# Patient Record
Sex: Female | Born: 1935 | Race: White | Hispanic: No | Smoking: Never smoker
Health system: Southern US, Community
[De-identification: ages and names within clinical notes are randomized; demographics above are authoritative.]

## PROBLEM LIST (undated history)

## (undated) DIAGNOSIS — M1990 Unspecified osteoarthritis, unspecified site: Secondary | ICD-10-CM

## (undated) DIAGNOSIS — I4891 Unspecified atrial fibrillation: Secondary | ICD-10-CM

## (undated) DIAGNOSIS — I10 Essential (primary) hypertension: Secondary | ICD-10-CM

---

## 2016-09-13 DIAGNOSIS — M1990 Unspecified osteoarthritis, unspecified site: Secondary | ICD-10-CM | POA: Insufficient documentation

## 2016-09-13 DIAGNOSIS — F419 Anxiety disorder, unspecified: Secondary | ICD-10-CM

## 2016-09-13 DIAGNOSIS — G479 Sleep disorder, unspecified: Secondary | ICD-10-CM

## 2016-09-13 DIAGNOSIS — K219 Gastro-esophageal reflux disease without esophagitis: Secondary | ICD-10-CM | POA: Insufficient documentation

## 2016-09-13 DIAGNOSIS — G609 Hereditary and idiopathic neuropathy, unspecified: Secondary | ICD-10-CM | POA: Insufficient documentation

## 2016-09-13 DIAGNOSIS — I10 Essential (primary) hypertension: Secondary | ICD-10-CM | POA: Insufficient documentation

## 2016-09-13 DIAGNOSIS — F32A Depression, unspecified: Secondary | ICD-10-CM

## 2016-09-13 DIAGNOSIS — E785 Hyperlipidemia, unspecified: Secondary | ICD-10-CM | POA: Insufficient documentation

## 2016-09-13 DIAGNOSIS — F329 Major depressive disorder, single episode, unspecified: Secondary | ICD-10-CM | POA: Insufficient documentation

## 2016-09-13 HISTORY — DX: Depression, unspecified: F32.A

## 2016-09-13 HISTORY — DX: Hyperlipidemia, unspecified: E78.5

## 2016-09-13 HISTORY — DX: Gastro-esophageal reflux disease without esophagitis: K21.9

## 2016-09-13 HISTORY — DX: Sleep disorder, unspecified: G47.9

## 2016-09-13 HISTORY — DX: Unspecified osteoarthritis, unspecified site: M19.90

## 2016-09-13 HISTORY — DX: Hereditary and idiopathic neuropathy, unspecified: G60.9

## 2018-04-10 DIAGNOSIS — M79604 Pain in right leg: Secondary | ICD-10-CM

## 2018-04-10 DIAGNOSIS — G4709 Other insomnia: Secondary | ICD-10-CM

## 2018-04-10 HISTORY — DX: Pain in right leg: M79.604

## 2018-04-10 HISTORY — DX: Other insomnia: G47.09

## 2018-08-05 ENCOUNTER — Ambulatory Visit: Payer: Medicare Other

## 2018-08-05 ENCOUNTER — Encounter: Payer: Self-pay | Admitting: Podiatry

## 2018-08-05 ENCOUNTER — Ambulatory Visit (INDEPENDENT_AMBULATORY_CARE_PROVIDER_SITE_OTHER): Payer: Medicare Other | Admitting: Podiatry

## 2018-08-05 VITALS — BP 131/73 | HR 62 | Resp 16

## 2018-08-05 DIAGNOSIS — L600 Ingrowing nail: Secondary | ICD-10-CM | POA: Diagnosis not present

## 2018-08-05 MED ORDER — NEOMYCIN-POLYMYXIN-HC 3.5-10000-1 OT SOLN: OTIC | 1 refills | Status: DC

## 2018-08-05 NOTE — Patient Instructions (Signed)

## 2018-08-06 NOTE — Progress Notes (Signed)
Subjective:   Patient ID: Toni Wells, female   DOB: 84 y.o.   MRN: [id number]   HPI Patient presents stating she has been having a lot of pain in her right second and third toes and nails and thinks they might be ingrown toenails but also realizes the toes are longer than they should be.  States it has been going on for a while she tries to keep them trimmed down the patient does not smoke and likes to be active and has trouble wearing shoes due to the pain   Review of Systems  All other systems reviewed and are negative.       Objective:  Physical Exam  Constitutional: He appears well-developed and well-nourished.  Cardiovascular: Intact distal pulses.  Pulmonary/Chest: Effort normal.  Musculoskeletal: Normal range of motion.  Neurological: He is alert.  Skin: Skin is warm.  Nursing note and vitals reviewed.   Neurovascular status intact muscle strength is adequate range of motion within normal limits with patient found to have incurvated right second and third nails lateral border with discomfort when I pressed and also mild elongation of the toes with digital deformity that is mild in intensity.  Patient is found to have good digital perfusion well oriented x3 with adequate  To the digits     Assessment:  Ingrown toenail deformity second and third toes right foot lateral border with pain and mild to moderate digital deformity which may be contributory     Plan:  H&P conditions reviewed and I explained ingrown toenails to the patient.  Patient wants to have them faxed and I explained procedure and risk and patient had each digit infiltrated 60 mg like a Marcaine mixture sterile prep applied to the toes and using sterile instrumentation the lateral borders of the second and third toes were removed the base was exposed and phenol applied 3 applications 30 seconds followed by alcohol lavage and sterile dressing.  Gave instructions on soaks and reappoint and encouraged to call with  questions and any throbbing were to occur take the dressing off immediately

## 2018-08-15 ENCOUNTER — Telehealth: Payer: Self-pay | Admitting: Podiatry

## 2018-08-15 NOTE — Telephone Encounter (Signed)
[facility] requesting office visit note from date of service 04 November. Please fax to [phone].

## 2018-11-25 DIAGNOSIS — M79671 Pain in right foot: Secondary | ICD-10-CM | POA: Insufficient documentation

## 2019-06-25 DIAGNOSIS — H26491 Other secondary cataract, right eye: Secondary | ICD-10-CM | POA: Insufficient documentation

## 2019-06-25 DIAGNOSIS — H43811 Vitreous degeneration, right eye: Secondary | ICD-10-CM | POA: Insufficient documentation

## 2019-06-25 DIAGNOSIS — H04541 Stenosis of right lacrimal canaliculi: Secondary | ICD-10-CM | POA: Insufficient documentation

## 2019-06-25 DIAGNOSIS — H35363 Drusen (degenerative) of macula, bilateral: Secondary | ICD-10-CM | POA: Insufficient documentation

## 2019-06-25 DIAGNOSIS — H04123 Dry eye syndrome of bilateral lacrimal glands: Secondary | ICD-10-CM | POA: Insufficient documentation

## 2019-06-25 DIAGNOSIS — H02883 Meibomian gland dysfunction of right eye, unspecified eyelid: Secondary | ICD-10-CM | POA: Insufficient documentation

## 2019-08-19 DIAGNOSIS — R5383 Other fatigue: Secondary | ICD-10-CM | POA: Insufficient documentation

## 2020-02-11 ENCOUNTER — Encounter: Payer: Self-pay | Admitting: Pulmonary Disease

## 2020-02-11 ENCOUNTER — Other Ambulatory Visit: Payer: Self-pay

## 2020-02-11 ENCOUNTER — Ambulatory Visit (INDEPENDENT_AMBULATORY_CARE_PROVIDER_SITE_OTHER): Payer: Medicare Other

## 2020-02-11 ENCOUNTER — Ambulatory Visit (INDEPENDENT_AMBULATORY_CARE_PROVIDER_SITE_OTHER): Payer: Medicare Other | Admitting: Pulmonary Disease

## 2020-02-11 VITALS — BP 118/64 | HR 67 | Temp 97.1°F | Ht 65.0 in | Wt 158.6 lb

## 2020-02-11 DIAGNOSIS — R0602 Shortness of breath: Secondary | ICD-10-CM | POA: Diagnosis not present

## 2020-02-11 NOTE — Progress Notes (Signed)
Toni Wells    [id number]    06-12-1932  Primary Care Physician:Howley, York Cerise, PA  Referring Physician: Willeen Niece, [place] [address] [place],  [place] [address]  Chief complaint:   Dyspnea with exertion for ~6 months   HPI: 86 year old without significant past medical history who is here for evaluation of dyspnea on exertion for approximately 6 months. Patient's xray in December showed bilateral atelectasis during a workup with a cardiologist. She notices the shortness of breath with walking up hills. Goes up 5 steps without difficulty , but notices this after approximately 14 steps. Works out regularly and notices some weakness after a while. Denies any dark stools, abnormal vaginal bleeding, chest pain, or lower extremity edema.   Pets: No pets, sometimes has her grandchildren's dogs at her house. Occupation: Retired  Exposures: no hot tub, or birds as pets  Smoking history: no smoking , second hand smoke for ~33 years  Travel history: [place], [place], [place] Last traveled in January 2019 to  [place]  Relevant family history: no family history of lung disease    Outpatient Encounter Medications as of 02/11/2020  Medication Sig  . escitalopram (LEXAPRO) 20 MG tablet Lexapro 20 mg tablet  Take 1 tablet every day by oral route for 90 days.  Marland Kitchen gabapentin (NEURONTIN) 100 MG capsule gabapentin 100 mg capsule  Take 1 capsule twice a day by oral route for 90 days.  . Glucosamine-Chondroitin 750-600 MG CHEW glucosamine 750 mg-chondroitin 600 mg chewable tablet  Take 1 tablet every day by oral route.  . hydrochlorothiazide (HYDRODIURIL) 12.5 MG tablet Take by mouth.  Marland Kitchen lisinopril (PRINIVIL,ZESTRIL) 40 MG tablet Take by mouth.  . Melatonin 5 MG CAPS melatonin 5 mg capsule  Take 1 capsule every day by oral route.  . meloxicam (MOBIC) 15 MG tablet Take by mouth.  . neomycin-polymyxin-hydrocortisone (CORTISPORIN) OTIC solution Apply 1-2 drops to toe after  soaking BID  . simvastatin (ZOCOR) 10 MG tablet simvastatin 10 mg tablet  Take 1 tablet every day by oral route for 90 days.   No facility-administered encounter medications on file as of 02/11/2020.    Allergies as of 02/11/2020 - Review Complete 02/11/2020  Allergen Reaction Noted  . Cephalexin Diarrhea 04/10/2018    No past medical history on file.   No family history on file.  Social History   Socioeconomic History  . Marital status: Unknown    Spouse name: Not on file  . Number of children: Not on file  . Years of education: Not on file  . Highest education level: Not on file  Occupational History  . Not on file  Tobacco Use  . Smoking status: Never Smoker  . Smokeless tobacco: Never Used  Substance and Sexual Activity  . Alcohol use: Yes  . Drug use: Not on file  . Sexual activity: Not on file  Other Topics Concern  . Not on file  Social History Narrative  . Not on file   Social Determinants of Health   Financial Resource Strain:   . Difficulty of Paying Living Expenses:   Food Insecurity:   . Worried About Charity fundraiser in the Last Year:   . Arboriculturist in the Last Year:   Transportation Needs:   . Film/video editor (Medical):   Marland Kitchen Lack of Transportation (Non-Medical):   Physical Activity:   . Days of Exercise per Week:   . Minutes of  Exercise per Session:   Stress:   . Feeling of Stress :   Social Connections:   . Frequency of Communication with Friends and Family:   . Frequency of Social Gatherings with Friends and Family:   . Attends Religious Services:   . Active Member of Clubs or Organizations:   . Attends Banker Meetings:   Marland Kitchen Marital Status:   Intimate Partner Violence:   . Fear of Current or Ex-Partner:   . Emotionally Abused:   Marland Kitchen Physically Abused:   . Sexually Abused:     Review of systems: Review of Systems  Constitutional: Negative for fever and chills.  HENT: Negative.   Eyes: Negative for blurred  vision.  Respiratory: as per HPI  Cardiovascular: Negative for chest pain and palpitations.  Gastrointestinal: Negative for vomiting, diarrhea, blood per rectum. Genitourinary: Negative for dysuria, urgency, frequency and hematuria.  Musculoskeletal: Negative for myalgias, back pain and joint pain.  Skin: Negative for itching and rash.  Neurological: Negative for dizziness, tremors, focal weakness, seizures and loss of consciousness.  Endo/Heme/Allergies: Negative for environmental allergies.  Psychiatric/Behavioral: Negative for depression, suicidal ideas and hallucinations.  All other systems reviewed and are negative.  Physical Exam: Blood pressure 118/64, pulse 67, temperature (!) 97.1 F (36.2 C), temperature source Temporal, height 5\' 5"  (1.651 m), weight 158 lb 9.6 oz (71.9 kg), SpO2 99 %.  General: NAD, nl appearance HE: Normocephalic, atraumatic , EOMI, pale conjunctivae  ENT: No congestion, no rhinorrhea, no exudate or erythema  Cardiovascular: Normal rate, regular rhythm.  No murmurs, rubs, or gallops Pulmonary : Effort normal, breath sounds normal. No wheezes, rales, or rhonchi Abdominal: soft, nontender,  bowel sounds present Musculoskeletal: no swelling , deformity, injury ,or tenderness in extremities, Skin: Warm, dry , no bruising, erythema, or rash Psychiatric/Behavioral:  normal mood, flat affect   Data Reviewed: Imaging: Not in our system Chest xray 09/04/2019:  Bibasilar atelectasis. No pleural effusion or pneumothorax. The cardiomediastinal silhouette is normal in size and contour.  PFTs: n/a  Labs:  CBC 08/18/2019 : WBC 6.8 , Eos 2% , Absolute eosinophils 136   Assessment:  [name] is a 86 year old female here for evaluation of her dyspnea on exertion she has been experiencing for approximately 6 months.  She has a second hand smoking history. We will get a Chest xray to compare to previous finding. If abnormal will consider a CT Chest. Also will obtain  PFT's to evaluate the lung function. We will have follow up to discuss results.  Patient conjunctiva appear pale, but no recent history of blood loss. Hgb 11.4 on 08/2019. She reports blood work scheduled for 5/24 , so we will hold off on any blood work orders today.   Plan/Recommendations: 2 view Chest xray Full PFT's Follow up after PFT'S   Attending note: I have seen and examined the patient. History, labs and imaging reviewed. Agree with assessment and plan  6/24 MD [facility] 02/11/2020, 1:49 PM  CC: 04/12/2020, PA

## 2020-02-11 NOTE — Patient Instructions (Signed)
Thank you for trusting [place] with your care.We will like to better evaluate your lungs with a chest xray today. We will order Pulmonary function test which show how well your lungs are working. We will follow the blood work ordered by your primary care doctor for 5/24. We will have you scheduled for follow up after your pulmonary function test have been completed.   Chest xray PFT's Follow up after PFT'S

## 2020-02-13 ENCOUNTER — Other Ambulatory Visit: Payer: Self-pay

## 2020-02-13 DIAGNOSIS — J9811 Atelectasis: Secondary | ICD-10-CM

## 2020-02-20 ENCOUNTER — Ambulatory Visit (HOSPITAL_COMMUNITY): Payer: Medicare Other

## 2020-02-24 ENCOUNTER — Encounter (HOSPITAL_COMMUNITY): Payer: Self-pay

## 2020-02-24 ENCOUNTER — Other Ambulatory Visit: Payer: Self-pay

## 2020-02-24 ENCOUNTER — Ambulatory Visit (HOSPITAL_COMMUNITY)
Admission: RE | Admit: 2020-02-24 | Discharge: 2020-02-24 | Disposition: A | Discharge status: Home / Self Care | Payer: Medicare Other | Source: Ambulatory Visit | Attending: Pulmonary Disease | Admitting: Pulmonary Disease

## 2020-02-24 DIAGNOSIS — J9811 Atelectasis: Secondary | ICD-10-CM | POA: Diagnosis present

## 2020-03-03 NOTE — Progress Notes (Signed)
Called to provide CT results.  Had to leave a VM.  Requested pt to return call.

## 2020-03-05 NOTE — Progress Notes (Signed)
Provided CT results to patient.  Verbalized understanding.

## 2020-03-26 ENCOUNTER — Other Ambulatory Visit (HOSPITAL_COMMUNITY)
Admission: RE | Admit: 2020-03-26 | Discharge: 2020-03-26 | Disposition: A | Discharge status: Home / Self Care | Payer: Medicare Other | Source: Ambulatory Visit | Attending: Pulmonary Disease | Admitting: Pulmonary Disease

## 2020-03-26 DIAGNOSIS — Z20822 Contact with and (suspected) exposure to covid-19: Secondary | ICD-10-CM | POA: Insufficient documentation

## 2020-03-26 DIAGNOSIS — Z01812 Encounter for preprocedural laboratory examination: Secondary | ICD-10-CM | POA: Diagnosis present

## 2020-03-26 LAB — SARS CORONAVIRUS 2 (TAT 6-24 HRS): SARS Coronavirus 2: NEGATIVE

## 2020-03-29 ENCOUNTER — Ambulatory Visit (INDEPENDENT_AMBULATORY_CARE_PROVIDER_SITE_OTHER): Payer: Medicare Other | Admitting: Pulmonary Disease

## 2020-03-29 ENCOUNTER — Other Ambulatory Visit: Payer: Self-pay

## 2020-03-29 DIAGNOSIS — R0602 Shortness of breath: Secondary | ICD-10-CM | POA: Diagnosis not present

## 2020-03-29 LAB — PULMONARY FUNCTION TEST
DL/VA % pred: 100 %
DL/VA: 3.93 ml/min/mmHg/L
DLCO cor % pred: 74 %
DLCO cor: 14.4 ml/min/mmHg
DLCO unc % pred: 71 %
DLCO unc: 13.88 ml/min/mmHg
FEF 25-75 Post: 1.69 L/sec
FEF 25-75 Pre: 1.01 L/sec
FEF2575-%Change-Post: 66 %
FEF2575-%Pred-Post: 148 %
FEF2575-%Pred-Pre: 89 %
FEV1-%Change-Post: 11 %
FEV1-%Pred-Post: 77 %
FEV1-%Pred-Pre: 69 %
FEV1-Post: 1.46 L
FEV1-Pre: 1.3 L
FEV1FVC-%Change-Post: 7 %
FEV1FVC-%Pred-Pre: 106 %
FEV6-%Change-Post: 5 %
FEV6-%Pred-Post: 70 %
FEV6-%Pred-Pre: 67 %
FEV6-Post: 1.8 L
FEV6-Pre: 1.71 L
FEV6FVC-%Change-Post: 1 %
FEV6FVC-%Pred-Post: 109 %
FEV6FVC-%Pred-Pre: 108 %
FVC-%Change-Post: 3 %
FVC-%Pred-Post: 64 %
FVC-%Pred-Pre: 62 %
FVC-Post: 1.81 L
FVC-Pre: 1.74 L
Post FEV1/FVC ratio: 81 %
Post FEV6/FVC ratio: 100 %
Pre FEV1/FVC ratio: 75 %
Pre FEV6/FVC Ratio: 98 %
RV % pred: 126 %
RV: 3.1 L
TLC % pred: 80 %
TLC: 4.72 L

## 2020-03-29 NOTE — Progress Notes (Signed)
PFT done today. 

## 2020-05-31 ENCOUNTER — Ambulatory Visit (INDEPENDENT_AMBULATORY_CARE_PROVIDER_SITE_OTHER): Payer: Medicare Other | Admitting: Pulmonary Disease

## 2020-05-31 ENCOUNTER — Other Ambulatory Visit: Payer: Self-pay

## 2020-05-31 ENCOUNTER — Encounter: Payer: Self-pay | Admitting: Pulmonary Disease

## 2020-05-31 VITALS — BP 110/60 | HR 75 | Temp 98.2°F | Ht 64.5 in | Wt 156.4 lb

## 2020-05-31 DIAGNOSIS — K869 Disease of pancreas, unspecified: Secondary | ICD-10-CM

## 2020-05-31 DIAGNOSIS — R0602 Shortness of breath: Secondary | ICD-10-CM | POA: Diagnosis not present

## 2020-05-31 NOTE — Patient Instructions (Signed)
We will get an MRI with and without contrast for a lesion found in the pancreas I do not see any clear evidence of interstitial lung disease on your CT scan and pulmonary function test  We will order PFTs in 1 year and follow-up in clinic after that.

## 2020-05-31 NOTE — Progress Notes (Signed)
Toni Wells    [id number]    [birth year]  Primary Care Physician:Howley, Cristie Hem, PA  Referring Physician: Maud Deed, PA [address] [place],  [place] [address]  Chief complaint:   Dyspnea with exertion for ~6 months   HPI: 86 year old without significant past medical history who is here for evaluation of dyspnea on exertion for approximately 6 months. Patient's xray in December showed bilateral atelectasis during a workup with a cardiologist. She notices the shortness of breath with walking up hills. Goes up 5 steps without difficulty , but notices this after approximately 14 steps. Works out regularly and notices some weakness after a while. Denies any dark stools, abnormal vaginal bleeding, chest pain, or lower extremity edema.   Pets: No pets, sometimes has her grandchildren's dogs at her house. Occupation: Retired  Exposures: no hot tub, or birds as pets  Smoking history: no smoking , second hand smoke for ~33 years  Travel history: [place], [place], [place]. Last traveled in January 2019 to  [place]  Relevant family history: no family history of lung disease   Interim history: Follow-up for review of PFTs and CT scan States that dyspnea is improved since last visit Has started an exercise regimen.   Outpatient Encounter Medications as of 05/31/2020  Medication Sig  . amLODipine (NORVASC) 10 MG tablet Take by mouth.  . carvedilol (COREG) 3.125 MG tablet Take 1 tablet by mouth 2 (two) times daily.  Marland Kitchen escitalopram (LEXAPRO) 20 MG tablet Lexapro 20 mg tablet  Take 1 tablet every day by oral route for 90 days.  . fluticasone (FLONASE) 50 MCG/ACT nasal spray Use 1 spray(s) in each nostril once daily  . Glucosamine-Chondroitin 750-600 MG CHEW glucosamine 750 mg-chondroitin 600 mg chewable tablet  Take 1 tablet every day by oral route.  . hydrochlorothiazide (HYDRODIURIL) 12.5 MG tablet Take by mouth.  Marland Kitchen lisinopril (PRINIVIL,ZESTRIL) 40 MG tablet  Take by mouth.  . rosuvastatin (CRESTOR) 5 MG tablet Take 1 tablet by mouth at bedtime.  . Melatonin 5 MG CAPS melatonin 5 mg capsule  Take 1 capsule every day by oral route. (Patient not taking: Reported on 05/31/2020)  . meloxicam (MOBIC) 15 MG tablet Take by mouth. (Patient not taking: Reported on 05/31/2020)  . [DISCONTINUED] gabapentin (NEURONTIN) 100 MG capsule gabapentin 100 mg capsule  Take 1 capsule twice a day by oral route for 90 days. (Patient not taking: Reported on 05/31/2020)  . [DISCONTINUED] neomycin-polymyxin-hydrocortisone (CORTISPORIN) OTIC solution Apply 1-2 drops to toe after soaking BID (Patient not taking: Reported on 05/31/2020)  . [DISCONTINUED] simvastatin (ZOCOR) 10 MG tablet simvastatin 10 mg tablet  Take 1 tablet every day by oral route for 90 days. (Patient not taking: Reported on 05/31/2020)   No facility-administered encounter medications on file as of 05/31/2020.    Physical Exam: Blood pressure 110/60, pulse 75, temperature 98.2 F (36.8 C), height 5' 4.5" (1.638 m), weight 156 lb 6.4 oz (70.9 kg), SpO2 94 %. Gen:      No acute distress HEENT:  EOMI, sclera anicteric Neck:     No masses; no thyromegaly Lungs:    Clear to auscultation bilaterally; normal respiratory effort CV:         Regular rate and rhythm; no murmurs Abd:      + bowel sounds; soft, non-tender; no palpable masses, no distension Ext:    No edema; adequate peripheral perfusion Skin:      Warm and dry; no  rash Neuro: alert and oriented x 3 Psych: normal mood and affect  Data Reviewed: Imaging: Not in our system Chest xray 09/04/2019:  Bibasilar atelectasis. No pleural effusion or pneumothorax. The cardiomediastinal silhouette is normal in size and contour.  CT high-resolution 02/24/2020-minimal changes of patchy subpleural reticulation at the base, bronchial wall thickening, 2 cm cystic focus in pancreas I have reviewed the images personally.  PFTs:  03/29/2020 FVC 1.81 [64%], FEV1 1.46  [17%], F/F 81, TLC 4.72 [80%], DLCO 13.88 [1010%] Minimal diffusion defect  Labs:  CBC 08/18/2019 : WBC 6.8 , Eos 2% , Absolute eosinophils 136   Assessment:  Dyspnea, abnormal chest x-ray CT personally reviewed with nonspecific changes.  No clear evidence of interstitial lung disease PFTs with minimal diffusion impairment.  Symptoms of dyspnea better with exercise Continue monitoring with PFTs in 1 year  Abnormal pancreas Possible cystic lesion noted We will get MRI for further evaluation.  Plan/Recommendations: PFTs in 1 year MRI abdomen for evaluation of pancreas  Chilton Greathouse MD [facility] 05/31/2020, 11:18 AM  CC: Maud Deed, PA

## 2020-06-01 NOTE — Addendum Note (Signed)
Addended by: Delrae Rend on: 06/01/2020 10:02 AM   Modules accepted: Orders

## 2020-06-30 ENCOUNTER — Ambulatory Visit
Admission: RE | Admit: 2020-06-30 | Discharge: 2020-06-30 | Disposition: A | Discharge status: Home / Self Care | Payer: Medicare Other | Source: Ambulatory Visit | Attending: Pulmonary Disease | Admitting: Pulmonary Disease

## 2020-06-30 DIAGNOSIS — K869 Disease of pancreas, unspecified: Secondary | ICD-10-CM

## 2021-05-06 DIAGNOSIS — Z9989 Dependence on other enabling machines and devices: Secondary | ICD-10-CM | POA: Insufficient documentation

## 2021-05-06 DIAGNOSIS — R600 Localized edema: Secondary | ICD-10-CM | POA: Insufficient documentation

## 2021-05-27 DIAGNOSIS — R609 Edema, unspecified: Secondary | ICD-10-CM | POA: Insufficient documentation

## 2021-07-05 DIAGNOSIS — I429 Cardiomyopathy, unspecified: Secondary | ICD-10-CM | POA: Insufficient documentation

## 2021-08-04 DIAGNOSIS — D649 Anemia, unspecified: Secondary | ICD-10-CM | POA: Insufficient documentation

## 2021-08-04 DIAGNOSIS — R002 Palpitations: Secondary | ICD-10-CM | POA: Insufficient documentation

## 2021-08-04 DIAGNOSIS — I2510 Atherosclerotic heart disease of native coronary artery without angina pectoris: Secondary | ICD-10-CM | POA: Insufficient documentation

## 2021-08-04 DIAGNOSIS — R0609 Other forms of dyspnea: Secondary | ICD-10-CM | POA: Insufficient documentation

## 2022-08-17 DIAGNOSIS — E871 Hypo-osmolality and hyponatremia: Secondary | ICD-10-CM | POA: Insufficient documentation

## 2022-08-17 DIAGNOSIS — F1090 Alcohol use, unspecified, uncomplicated: Secondary | ICD-10-CM | POA: Insufficient documentation

## 2022-09-21 DIAGNOSIS — M1712 Unilateral primary osteoarthritis, left knee: Secondary | ICD-10-CM | POA: Insufficient documentation

## 2023-10-09 DIAGNOSIS — Z6825 Body mass index (BMI) 25.0-25.9, adult: Secondary | ICD-10-CM | POA: Insufficient documentation

## 2024-02-29 ENCOUNTER — Emergency Department (HOSPITAL_BASED_OUTPATIENT_CLINIC_OR_DEPARTMENT_OTHER)

## 2024-02-29 ENCOUNTER — Inpatient Hospital Stay (HOSPITAL_BASED_OUTPATIENT_CLINIC_OR_DEPARTMENT_OTHER)
Admission: EM | Admit: 2024-02-29 | Discharge: 2024-03-02 | DRG: 293 | Disposition: A | Discharge status: Home / Self Care | Attending: Internal Medicine | Admitting: Internal Medicine

## 2024-02-29 ENCOUNTER — Encounter (HOSPITAL_BASED_OUTPATIENT_CLINIC_OR_DEPARTMENT_OTHER): Payer: Self-pay | Admitting: Emergency Medicine

## 2024-02-29 ENCOUNTER — Other Ambulatory Visit: Payer: Self-pay

## 2024-02-29 DIAGNOSIS — F32A Depression, unspecified: Secondary | ICD-10-CM | POA: Diagnosis present

## 2024-02-29 DIAGNOSIS — K219 Gastro-esophageal reflux disease without esophagitis: Secondary | ICD-10-CM | POA: Diagnosis present

## 2024-02-29 DIAGNOSIS — E876 Hypokalemia: Secondary | ICD-10-CM | POA: Diagnosis not present

## 2024-02-29 DIAGNOSIS — I342 Nonrheumatic mitral (valve) stenosis: Secondary | ICD-10-CM | POA: Diagnosis not present

## 2024-02-29 DIAGNOSIS — I5031 Acute diastolic (congestive) heart failure: Secondary | ICD-10-CM | POA: Diagnosis present

## 2024-02-29 DIAGNOSIS — Z881 Allergy status to other antibiotic agents status: Secondary | ICD-10-CM

## 2024-02-29 DIAGNOSIS — I272 Pulmonary hypertension, unspecified: Secondary | ICD-10-CM | POA: Diagnosis present

## 2024-02-29 DIAGNOSIS — Z888 Allergy status to other drugs, medicaments and biological substances status: Secondary | ICD-10-CM | POA: Diagnosis not present

## 2024-02-29 DIAGNOSIS — Z79899 Other long term (current) drug therapy: Secondary | ICD-10-CM

## 2024-02-29 DIAGNOSIS — G629 Polyneuropathy, unspecified: Secondary | ICD-10-CM | POA: Diagnosis present

## 2024-02-29 DIAGNOSIS — F419 Anxiety disorder, unspecified: Secondary | ICD-10-CM | POA: Diagnosis present

## 2024-02-29 DIAGNOSIS — I110 Hypertensive heart disease with heart failure: Secondary | ICD-10-CM | POA: Diagnosis not present

## 2024-02-29 DIAGNOSIS — I052 Rheumatic mitral stenosis with insufficiency: Secondary | ICD-10-CM | POA: Diagnosis present

## 2024-02-29 DIAGNOSIS — E785 Hyperlipidemia, unspecified: Secondary | ICD-10-CM | POA: Diagnosis present

## 2024-02-29 DIAGNOSIS — I509 Heart failure, unspecified: Secondary | ICD-10-CM | POA: Diagnosis present

## 2024-02-29 DIAGNOSIS — Z7901 Long term (current) use of anticoagulants: Secondary | ICD-10-CM | POA: Diagnosis not present

## 2024-02-29 DIAGNOSIS — I10 Essential (primary) hypertension: Secondary | ICD-10-CM | POA: Diagnosis present

## 2024-02-29 DIAGNOSIS — I159 Secondary hypertension, unspecified: Principal | ICD-10-CM

## 2024-02-29 DIAGNOSIS — E8770 Fluid overload, unspecified: Secondary | ICD-10-CM

## 2024-02-29 DIAGNOSIS — I480 Paroxysmal atrial fibrillation: Secondary | ICD-10-CM | POA: Diagnosis present

## 2024-02-29 DIAGNOSIS — R079 Chest pain, unspecified: Secondary | ICD-10-CM | POA: Diagnosis present

## 2024-02-29 DIAGNOSIS — I5033 Acute on chronic diastolic (congestive) heart failure: Secondary | ICD-10-CM | POA: Diagnosis not present

## 2024-02-29 HISTORY — DX: Unspecified atrial fibrillation: I48.91

## 2024-02-29 HISTORY — DX: Essential (primary) hypertension: I10

## 2024-02-29 HISTORY — DX: Heart failure, unspecified: I50.9

## 2024-02-29 HISTORY — DX: Unspecified osteoarthritis, unspecified site: M19.90

## 2024-02-29 LAB — BASIC METABOLIC PANEL WITH GFR
Anion gap: 13 (ref 5–15)
BUN: 12 mg/dL (ref 8–23)
CO2: 22 mmol/L (ref 22–32)
Calcium: 9.5 mg/dL (ref 8.9–10.3)
Chloride: 100 mmol/L (ref 98–111)
Creatinine, Ser: 0.76 mg/dL (ref 0.44–1.00)
GFR, Estimated: >60 mL/min (ref >60)
Glucose, Bld: 91 mg/dL (ref 70–99)
Potassium: 4.1 mmol/L (ref 3.5–5.1)
Sodium: 136 mmol/L (ref 135–145)

## 2024-02-29 LAB — CBC
HCT: 35.7 % — ABNORMAL LOW (ref 36.0–46.0)
Hemoglobin: 11.5 g/dL — ABNORMAL LOW (ref 12.0–15.0)
MCH: 27.4 pg (ref 26.0–34.0)
MCHC: 32.2 g/dL (ref 30.0–36.0)
MCV: 85.2 fL (ref 80.0–100.0)
Platelets: 282 10*3/uL (ref 150–400)
RBC: 4.19 MIL/uL (ref 3.87–5.11)
RDW: 14.4 % (ref 11.5–15.5)
WBC: 8.6 10*3/uL (ref 4.0–10.5)
nRBC: 0 % (ref 0.0–0.2)

## 2024-02-29 LAB — TROPONIN T, HIGH SENSITIVITY: Troponin T High Sensitivity: <15 ng/L (ref <19)

## 2024-02-29 LAB — PRO BRAIN NATRIURETIC PEPTIDE: Pro Brain Natriuretic Peptide: 2848 pg/mL — ABNORMAL HIGH (ref <300.0)

## 2024-02-29 MED ORDER — PROCHLORPERAZINE EDISYLATE 10 MG/2ML IJ SOLN: 5.0000 mg | Freq: Four times a day (QID) | INTRAMUSCULAR | Status: DC | PRN

## 2024-02-29 MED ORDER — FUROSEMIDE 10 MG/ML IJ SOLN
20.0000 mg | Freq: Two times a day (BID) | INTRAMUSCULAR | Status: DC
  Administered 2024-03-01 (×2): 20 mg via INTRAVENOUS
  Filled 2024-02-29 (×2): qty 2

## 2024-02-29 MED ORDER — APIXABAN 5 MG PO TABS
5.0000 mg | ORAL_TABLET | Freq: Two times a day (BID) | ORAL | Status: DC
  Administered 2024-03-01 – 2024-03-02 (×3): 5 mg via ORAL
  Filled 2024-02-29 (×3): qty 1

## 2024-02-29 MED ORDER — HYDRALAZINE HCL 20 MG/ML IJ SOLN: 10.0000 mg | Freq: Once | INTRAMUSCULAR | Status: DC

## 2024-02-29 MED ORDER — ENOXAPARIN SODIUM 40 MG/0.4ML IJ SOSY: 40.0000 mg | PREFILLED_SYRINGE | INTRAMUSCULAR | Status: DC

## 2024-02-29 MED ORDER — MELATONIN 5 MG PO TABS: 5.0000 mg | ORAL_TABLET | Freq: Every evening | ORAL | Status: DC | PRN

## 2024-02-29 MED ORDER — ROSUVASTATIN CALCIUM 5 MG PO TABS
5.0000 mg | ORAL_TABLET | Freq: Every day | ORAL | Status: DC
  Administered 2024-03-01: 5 mg via ORAL
  Filled 2024-02-29: qty 1

## 2024-02-29 MED ORDER — FUROSEMIDE 10 MG/ML IJ SOLN
40.0000 mg | Freq: Once | INTRAMUSCULAR | Status: AC
  Administered 2024-02-29: 40 mg via INTRAVENOUS
  Filled 2024-02-29: qty 4

## 2024-02-29 MED ORDER — AMLODIPINE BESYLATE 5 MG PO TABS
5.0000 mg | ORAL_TABLET | Freq: Once | ORAL | Status: AC
  Administered 2024-02-29: 5 mg via ORAL
  Filled 2024-02-29: qty 1

## 2024-02-29 MED ORDER — PANTOPRAZOLE SODIUM 40 MG PO TBEC
40.0000 mg | DELAYED_RELEASE_TABLET | Freq: Two times a day (BID) | ORAL | Status: DC
  Administered 2024-03-01 – 2024-03-02 (×3): 40 mg via ORAL
  Filled 2024-02-29 (×3): qty 1

## 2024-02-29 MED ORDER — ACETAMINOPHEN 325 MG PO TABS: 650.0000 mg | ORAL_TABLET | Freq: Four times a day (QID) | ORAL | Status: DC | PRN

## 2024-02-29 MED ORDER — GABAPENTIN 100 MG PO CAPS
100.0000 mg | ORAL_CAPSULE | Freq: Three times a day (TID) | ORAL | Status: DC
  Administered 2024-03-01 – 2024-03-02 (×4): 100 mg via ORAL
  Filled 2024-02-29 (×4): qty 1

## 2024-02-29 MED ORDER — POLYETHYLENE GLYCOL 3350 17 G PO PACK: 17.0000 g | PACK | Freq: Every day | ORAL | Status: DC | PRN

## 2024-02-29 MED ORDER — ESCITALOPRAM OXALATE 20 MG PO TABS
20.0000 mg | ORAL_TABLET | Freq: Every day | ORAL | Status: DC
  Administered 2024-03-01: 20 mg via ORAL
  Filled 2024-02-29: qty 1

## 2024-02-29 MED ORDER — BUSPIRONE HCL 10 MG PO TABS
10.0000 mg | ORAL_TABLET | Freq: Two times a day (BID) | ORAL | Status: DC
  Administered 2024-03-01 – 2024-03-02 (×3): 10 mg via ORAL
  Filled 2024-02-29 (×3): qty 1

## 2024-02-29 NOTE — ED Notes (Signed)
 This RN called Laboratory to add on BNP.

## 2024-02-29 NOTE — H&P (Addendum)
 History and Physical  Toni Wells WUJ:[id number] DOB: 1932-08-15 DOA: 02/29/2024  Referring physician: Accepted by Dr. Colvin Dec, [facility], hospitalist service. PCP: Benita Bramble, PA  Outpatient Specialists: Cardiology Patient coming from: Home through [facility] ED.  Chief Complaint: Shortness of breath.  HPI: Toni Wells is a 89 y.o. female with medical history significant for paroxysmal A-fib on Eliquis , uncontrolled hypertension, who  initially presented to [facility] ED with complaints of progressively worsening shortness of breath associated with bilateral leg swelling for the past few months.  No reported subjective fevers or chills.  Denies any substantial chest pain or palpitations.  In the ER, volume overload on exam with bilateral lower extremity edema, and elevated proBNP greater than 2800.  Chest x-ray revealed mild cardiomegaly, concern for mild pulmonary edema, and bibasilar scaring.  The patient received a dose of IV Lasix  40 mg x 1 with improvement of her symptomatology.  EDP requested admission for further management of acute CHF.  Admitted by [facility], hospitalist service.  Accepted by Dr. Colvin Dec and transferred to [facility] telemetry unit as inpatient status.  At the time of this visit, the patient is in the room accompanied by her daughter.  She is alert and oriented x 3.  Hearing aids in her left ear.  States her dyspnea has improved after diuresing.  Her bilateral lower extremity edema is also improved.  No chest pain reported at the time of this visit.  ED Course: Temperature 97.7.  BP 159/63, pulse 56, respiration rate 23, O2 saturation 97% on room air.  Review of Systems: Review of systems as noted in the HPI. All other systems reviewed and are negative.   Past Medical History:  Diagnosis Date   Arthritis    Atrial fibrillation (HCC)    Hypertension    No past surgical history on file.  Social History:  reports that she has never smoked. She has never used smokeless tobacco.  She reports that she does not currently use alcohol. She reports that she does not currently use drugs.   Allergies  Allergen Reactions   Cephalexin Diarrhea    Other reaction(s): Abdominal pain    Family history: None reported.  Prior to Admission medications   Medication Sig Start Date End Date Taking? Authorizing Provider  amLODipine  (NORVASC ) 10 MG tablet Take by mouth. 05/15/19   [provider]  carvedilol (COREG) 3.125 MG tablet Take 1 tablet by mouth 2 (two) times daily. 05/14/20   [provider]  escitalopram  (LEXAPRO ) 20 MG tablet Lexapro  20 mg tablet  Take 1 tablet every day by oral route for 90 days.    [provider]  fluticasone Odis Bennetts) 50 MCG/ACT nasal spray Use 1 spray(s) in each nostril once daily 01/03/19   [provider]  Glucosamine-Chondroitin 750-600 MG CHEW glucosamine 750 mg-chondroitin 600 mg chewable tablet  Take 1 tablet every day by oral route.    [provider]  hydrochlorothiazide (HYDRODIURIL) 12.5 MG tablet Take by mouth. 07/23/18   [provider]  lisinopril (PRINIVIL,ZESTRIL) 40 MG tablet Take by mouth. 07/23/18   [provider]  Melatonin 5 MG CAPS melatonin 5 mg capsule  Take 1 capsule every day by oral route. Patient not taking: Reported on 05/31/2020    [provider]  meloxicam (MOBIC) 15 MG tablet Take by mouth. Patient not taking: Reported on 05/31/2020 05/20/18   [provider]  rosuvastatin  (CRESTOR ) 5 MG tablet Take 1 tablet by mouth at bedtime. 05/14/20   [provider]  Physical Exam: BP (!) 179/65 (BP Location: Left Arm)   Pulse (!) 54   Temp 97.7 F (36.5 C)   Resp 18   Ht 5' 4.5" (1.638 m)   Wt 71.2 kg   SpO2 97%   BMI 26.53 kg/m   General: 89 y.o. year-old female well developed well nourished in no acute distress.  Alert and oriented x3. Cardiovascular: Regular rate and rhythm with no rubs or gallops.  No thyromegaly or JVD noted.   Trace lower extremity edema bilaterally.   Respiratory: Faint rales at bases. Good inspiratory effort. Abdomen: Soft nontender nondistended with normal bowel sounds x4 quadrants. Muskuloskeletal: No cyanosis or clubbing noted bilaterally Neuro: CN II-XII intact, strength, sensation, reflexes Skin: No ulcerative lesions noted or rashes Psychiatry: Judgement and insight appear normal. Mood is appropriate for condition and setting          Labs on Admission:  Basic Metabolic Panel: Recent Labs  Lab 02/29/24 1426  NA 136  K 4.1  CL 100  CO2 22  GLUCOSE 91  BUN 12  CREATININE 0.76  CALCIUM  9.5   Liver Function Tests: No results for input(s): "AST", "ALT", "ALKPHOS", "BILITOT", "PROT", "ALBUMIN" in the last 168 hours. No results for input(s): "LIPASE", "AMYLASE" in the last 168 hours. No results for input(s): "AMMONIA" in the last 168 hours. CBC: Recent Labs  Lab 02/29/24 1426  WBC 8.6  HGB 11.5*  HCT 35.7*  MCV 85.2  PLT 282   Cardiac Enzymes: No results for input(s): "CKTOTAL", "CKMB", "CKMBINDEX", "TROPONINI" in the last 168 hours.  BNP (last 3 results) No results for input(s): "BNP" in the last 8760 hours.  ProBNP (last 3 results) Recent Labs    02/29/24 1426  PROBNP 2,848.0*    CBG: No results for input(s): "GLUCAP" in the last 168 hours.  Radiological Exams on Admission: CT Head Wo Contrast Result Date: 02/29/2024 CLINICAL DATA:  Headache, increasing frequency or severity. EXAM: CT HEAD WITHOUT CONTRAST TECHNIQUE: Contiguous axial images were obtained from the base of the skull through the vertex without intravenous contrast. RADIATION DOSE REDUCTION: This exam was performed according to the departmental dose-optimization program which includes automated exposure control, adjustment of the mA and/or kV according to patient size and/or use of iterative reconstruction technique. COMPARISON:  None Available. FINDINGS: Brain: No acute intracranial hemorrhage. No  CT evidence of acute infarct. Nonspecific hypoattenuation in the periventricular and subcortical white matter favored to reflect chronic microvascular ischemic changes. Possible small remote lacunar infarcts in the right corona radiata. Mild parenchymal volume loss. No edema, mass effect, or midline shift. The basilar cisterns are patent. Ventricles: The ventricles are normal. Vascular: Atherosclerotic calcifications of the carotid siphons and intracranial vertebral arteries. No hyperdense vessel. Skull: No acute or aggressive finding. Orbits: Bilateral lens replacement. Sinuses: Mucosal thickening in the right maxillary sinus with thickening of the sinus walls suggestive of mucoperiosteal reaction. Additional mucosal thickening in the right frontal sinus. Other: Mastoid air cells are clear. IMPRESSION: No CT evidence of acute intracranial abnormality. Chronic microvascular ischemic changes. Possible small remote lacunar infarcts in the right corona radiata. Mild parenchymal volume loss. Chronic right maxillary sinusitis. Electronically Signed   By: Denny Flack M.D.   On: 02/29/2024 19:26   DG Chest 2 View Result Date: 02/29/2024 CLINICAL DATA:  Shortness of breath. EXAM: CHEST - 2 VIEW COMPARISON:  02/11/2020 FINDINGS: Mild cardiomegaly. Mild bibasilar scarring also stable. No evidence of acute infiltrate or edema. No pleural effusion. IMPRESSION: Mild cardiomegaly and bibasilar  scarring. No active lung disease. Electronically Signed   By: Marlyce Sine M.D.   On: 02/29/2024 17:02    EKG: I independently viewed the EKG done and my findings are as followed: Sinus rhythm rate of 62.  Nonspecific ST-T changes.  QTc 489.  Assessment/Plan Present on Admission: **None**  Principal Problem:   CHF (congestive heart failure) (HCC)  Acute CHF, unspecified Presented with bilateral lower extremity edema, mild cardiomegaly and mild pulmonary edema seen on chest x-ray, elevated proBNP greater than 2800. IV  diuresis initiated in the ER, continue Monitor strict I's and O's and daily weight Follow 2D echo Consider cardiology consultation in the morning  Hypertension, blood pressure is not at goal, elevated Closely monitor vital signs while on IV diuretics Resume home oral antihypertensives as blood pressure allows  Paroxysmal A-fib on Eliquis  Resume home Eliquis  and home Nebivolol Monitor on telemetry  Chronic anxiety/depression Resume home Lexapro  and buspirone   Polyneuropathy Resume home gabapentin   Hyperlipidemia Resume home Crestor   GERD Resume home PPI twice daily  Generalized weakness PT OT evaluation Fall precautions.   Critical care time: 55 minutes.   DVT prophylaxis: Home Eliquis   Code Status: Full code.  Family Communication: Updated the patient's daughter at bedside.  Disposition Plan: Admitted to telemetry unit.  Consults called: None.  Admission status: Inpatient status.   Status is: Inpatient The patient requires at least 2 midnights for further evaluation and treatment of present condition.   Bary Boss MD [facility] Pager [phone]  If 7PM-7AM, please contact night-coverage [email or web address] Password [email or web address]  02/29/2024, 10:31 PM

## 2024-02-29 NOTE — ED Triage Notes (Signed)
 Pt POV shuffling gait- reports PCP hasn't been able to get her bp down for last 6 months. Denies CP.  Reports Dyspnea on exertion, BLLE swelling since Christmas.  Family reports DOE has been worsening over last 6 months.   Reports compliance with medications.

## 2024-02-29 NOTE — ED Provider Notes (Signed)
 [place] EMERGENCY DEPARTMENT AT [facility] [place] Provider Note   CSN: [id number] Arrival date & time: 02/29/24  1415     History  Chief Complaint  Patient presents with  . Hypertension    Toni Wells is a 89 y.o. female.  Patient here with shortness of breath high blood pressure.  Symptoms worse through the last week or so but has noticed some shortness of breath with exertion the last few months.  Noticed some leg swelling at times as well.  Blood pressure has been more difficult to control recently as well.  Has been in the 180s here recently.  She has had some mild headaches.  No chest pain.  Has found herself breathless at times when she is lying flat.  History of A-fib on Eliquis.  History of hypertension as well.  The history is provided by the patient.       Home Medications Prior to Admission medications   Medication Sig Start Date End Date Taking? Authorizing Provider  amLODipine (NORVASC) 10 MG tablet Take by mouth. 05/15/19   [provider]  carvedilol (COREG) 3.125 MG tablet Take 1 tablet by mouth 2 (two) times daily. 05/14/20   [provider]  escitalopram (LEXAPRO) 20 MG tablet Lexapro 20 mg tablet  Take 1 tablet every day by oral route for 90 days.    [provider]  fluticasone Odis Bennetts) 50 MCG/ACT nasal spray Use 1 spray(s) in each nostril once daily 01/03/19   [provider]  Glucosamine-Chondroitin 750-600 MG CHEW glucosamine 750 mg-chondroitin 600 mg chewable tablet  Take 1 tablet every day by oral route.    [provider]  hydrochlorothiazide (HYDRODIURIL) 12.5 MG tablet Take by mouth. 07/23/18   [provider]  lisinopril (PRINIVIL,ZESTRIL) 40 MG tablet Take by mouth. 07/23/18   [provider]  Melatonin 5 MG CAPS melatonin 5 mg capsule  Take 1 capsule every day by oral route. Patient not taking: Reported on 05/31/2020    [provider]  meloxicam (MOBIC) 15 MG tablet Take  by mouth. Patient not taking: Reported on 05/31/2020 05/20/18   [provider]  rosuvastatin (CRESTOR) 5 MG tablet Take 1 tablet by mouth at bedtime. 05/14/20   [provider]      Allergies    Cephalexin    Review of Systems   Review of Systems  Physical Exam Updated Vital Signs BP (!) 180/58   Pulse (!) 57   Temp (!) 96.9 F (36.1 C)   Resp 13   Ht 5' 4.5" (1.638 m)   Wt 71.2 kg   SpO2 97%   BMI 26.53 kg/m  Physical Exam Vitals and nursing note reviewed.  Constitutional:      General: She is not in acute distress.    Appearance: She is well-developed. She is not ill-appearing.  HENT:     Head: Normocephalic and atraumatic.     Nose: Nose normal.     Mouth/Throat:     Mouth: Mucous membranes are moist.  Eyes:     Extraocular Movements: Extraocular movements intact.     Conjunctiva/sclera: Conjunctivae normal.     Pupils: Pupils are equal, round, and reactive to light.  Cardiovascular:     Rate and Rhythm: Normal rate and regular rhythm.     Pulses: Normal pulses.     Heart sounds: Normal heart sounds. No murmur heard. Pulmonary:     Effort: Pulmonary effort is normal. No respiratory distress.     Breath  sounds: Normal breath sounds.  Abdominal:     General: Abdomen is flat.     Palpations: Abdomen is soft.     Tenderness: There is no abdominal tenderness.  Musculoskeletal:        General: No swelling.     Cervical back: Normal range of motion and neck supple.     Right lower leg: Edema present.     Left lower leg: Edema present.  Skin:    General: Skin is warm and dry.     Capillary Refill: Capillary refill takes less than 2 seconds.  Neurological:     Mental Status: She is alert.  Psychiatric:        Mood and Affect: Mood normal.     ED Results / Procedures / Treatments   Labs (all labs ordered are listed, but only abnormal results are displayed) Labs Reviewed  CBC - Abnormal; Notable for the following components:      Result  Value   Hemoglobin 11.5 (*)    HCT 35.7 (*)    All other components within normal limits  PRO BRAIN NATRIURETIC PEPTIDE - Abnormal; Notable for the following components:   Pro Brain Natriuretic Peptide 2,848.0 (*)    All other components within normal limits  BASIC METABOLIC PANEL WITH GFR  TROPONIN T, HIGH SENSITIVITY    EKG None  Radiology DG Chest 2 View Result Date: 02/29/2024 CLINICAL DATA:  Shortness of breath. EXAM: CHEST - 2 VIEW COMPARISON:  02/11/2020 FINDINGS: Mild cardiomegaly. Mild bibasilar scarring also stable. No evidence of acute infiltrate or edema. No pleural effusion. IMPRESSION: Mild cardiomegaly and bibasilar scarring. No active lung disease. Electronically Signed   By: Marlyce Sine M.D.   On: 02/29/2024 17:02    Procedures Procedures    Medications Ordered in ED Medications  hydrALAZINE (APRESOLINE) injection 10 mg (has no administration in time range)  amLODipine (NORVASC) tablet 5 mg (5 mg Oral Given 02/29/24 1611)  furosemide (LASIX) injection 40 mg (40 mg Intravenous Given 02/29/24 1611)    ED Course/ Medical Decision Making/ A&P Clinical Course as of 02/29/24 1911  Fri Feb 29, 2024  1910 CT Head Wo Contrast [AC]    Clinical Course User Index [AC] Lowery Rue, DO                                 Medical Decision Making Amount and/or Complexity of Data Reviewed Labs: ordered. Radiology: ordered.  Risk Prescription drug management. Decision regarding hospitalization.   Toni Wells is here with shortness of breath high blood pressure.  Blood pressure initially unremarkable but on my check she is 194/68.  No chest pain.  She has been having some shortness of breath with exertion here recently.  Maybe the last few months but here worse the last few days now with some more leg swelling than normal.  She denies any weakness numbness tingling.  No vision loss or speech changes.  Has had some mild headaches.  Has a mild headache now.  EKG shows  sinus rhythm.  No ischemic changes.  She has a history of A-fib on Eliquis.  Differential diagnosis could be new heart failure, less likely ACS.  I have much lower concern for stroke or head bleed.  Does not sound infectious in nature.  Will get CBC BMP troponin BNP chest x-ray head CT.  Per my review and interpretation the labs BNP is 2800.  Signs of volume  overload on exam with peripheral edema.  Normal troponin.  Lab work otherwise unremarkable.  Will give her dose of IV Lasix give her a dose of Norvasc.  She started taking her blood pressure medications otherwise.  I do think that this is likely new heart failure in the setting of uncontrolled high blood pressure.  Will admit for further care.  Awaiting for head CT to be done.  Head CT per my review interpretation is unremarkable.  Will admit patient to hospitalist for further care.  This chart was dictated using voice recognition software.  Despite best efforts to proofread,  errors can occur which can change the documentation meaning.         Final Clinical Impression(s) / ED Diagnoses Final diagnoses:  Secondary hypertension  Hypervolemia, unspecified hypervolemia type  Heart failure, unspecified HF chronicity, unspecified heart failure type [facility])    Rx / DC Orders ED Discharge Orders     None         Lowery Rue, DO 02/29/24 1737

## 2024-03-01 ENCOUNTER — Encounter (HOSPITAL_COMMUNITY): Payer: Self-pay | Admitting: Internal Medicine

## 2024-03-01 ENCOUNTER — Inpatient Hospital Stay (HOSPITAL_COMMUNITY)

## 2024-03-01 DIAGNOSIS — I5031 Acute diastolic (congestive) heart failure: Secondary | ICD-10-CM | POA: Diagnosis not present

## 2024-03-01 DIAGNOSIS — I5033 Acute on chronic diastolic (congestive) heart failure: Secondary | ICD-10-CM | POA: Diagnosis not present

## 2024-03-01 LAB — ECHOCARDIOGRAM COMPLETE
AR max vel: 1.94 cm2
AV Area VTI: 1.93 cm2
AV Area mean vel: 1.98 cm2
AV Mean grad: 7.8 mmHg
AV Peak grad: 15.7 mmHg
Ao pk vel: 1.98 m/s
Area-P 1/2: 5.13 cm2
Calc EF: 63.4 %
Height: 64.5 in
MV M vel: 5.11 m/s
MV Peak grad: 104.4 mmHg
MV VTI: 2.11 cm2
Radius: 0.6 cm
S' Lateral: 2.9 cm
Single Plane A2C EF: 62.2 %
Single Plane A4C EF: 63.8 %
Weight: 2512 [oz_av]

## 2024-03-01 LAB — BASIC METABOLIC PANEL WITH GFR
Anion gap: 11 (ref 5–15)
BUN: 16 mg/dL (ref 8–23)
CO2: 27 mmol/L (ref 22–32)
Calcium: 9.1 mg/dL (ref 8.9–10.3)
Chloride: 99 mmol/L (ref 98–111)
Creatinine, Ser: 0.77 mg/dL (ref 0.44–1.00)
GFR, Estimated: >60 mL/min (ref >60)
Glucose, Bld: 94 mg/dL (ref 70–99)
Potassium: 3.6 mmol/L (ref 3.5–5.1)
Sodium: 137 mmol/L (ref 135–145)

## 2024-03-01 LAB — CBC
HCT: 36.8 % (ref 36.0–46.0)
Hemoglobin: 11.9 g/dL — ABNORMAL LOW (ref 12.0–15.0)
MCH: 27.5 pg (ref 26.0–34.0)
MCHC: 32.3 g/dL (ref 30.0–36.0)
MCV: 85 fL (ref 80.0–100.0)
Platelets: 303 10*3/uL (ref 150–400)
RBC: 4.33 MIL/uL (ref 3.87–5.11)
RDW: 14.3 % (ref 11.5–15.5)
WBC: 8.3 10*3/uL (ref 4.0–10.5)
nRBC: 0 % (ref 0.0–0.2)

## 2024-03-01 LAB — MAGNESIUM: Magnesium: 1.9 mg/dL (ref 1.7–2.4)

## 2024-03-01 LAB — PHOSPHORUS: Phosphorus: 5.1 mg/dL — ABNORMAL HIGH (ref 2.5–4.6)

## 2024-03-01 MED ORDER — NEBIVOLOL HCL 2.5 MG PO TABS
2.5000 mg | ORAL_TABLET | Freq: Every day | ORAL | Status: DC
  Administered 2024-03-01 – 2024-03-02 (×2): 2.5 mg via ORAL
  Filled 2024-03-01 (×2): qty 1

## 2024-03-01 NOTE — Plan of Care (Signed)
  Problem: Education: Goal: Knowledge of General Education information will improve Description: Including pain rating scale, medication(s)/side effects and non-pharmacologic comfort measures Outcome: Progressing   Problem: Health Behavior/Discharge Planning: Goal: Ability to manage health-related needs will improve Outcome: Progressing   Problem: Clinical Measurements: Goal: Diagnostic test results will improve Outcome: Progressing   Problem: Activity: Goal: Risk for activity intolerance will decrease Outcome: Progressing   Problem: Nutrition: Goal: Adequate nutrition will be maintained Outcome: Progressing   Problem: Elimination: Goal: Will not experience complications related to bowel motility Outcome: Progressing Goal: Will not experience complications related to urinary retention Outcome: Progressing   Problem: Pain Managment: Goal: General experience of comfort will improve and/or be controlled Outcome: Progressing   Problem: Safety: Goal: Ability to remain free from injury will improve Outcome: Progressing

## 2024-03-01 NOTE — Progress Notes (Signed)
 PROGRESS NOTE    Toni Wells  WUJ:[id number] DOB: 05/16/1932 DOA: 02/29/2024 PCP: Benita Bramble, PA    Brief Narrative:  89 year old with history of paroxysmal A-fib on Eliquis , uncontrolled hypertension presenting with progressively worsening shortness of breath and bilateral leg swelling for last few months.  In the emergency room found to have extremity edema, BNP 2800.  Given Lasix  and admitted as CHF exacerbation.  Hemodynamically stable on admission. Patient does have history of A-fib currently in sinus rhythm, previous ejection fraction 50% as done 2 years ago with mitral regurgitation.   Subjective: Patient seen and examined.  No overnight events.  Leg swelling has improved.  She has not mobilized yet. Urine output, 3800 mL overnight Remains in sinus rhythm.  Assessment & Plan:   Acute CHF, suspected diastolic CHF: Lasix  20 mg IV twice daily, tolerating well with brisk diuresis Patient already on Bystolic.  Continued. Continue intake output monitoring.  Continue IV Lasix  today. Morning labs are pending. Echocardiogram pending. GDMT once we have ejection fraction available.  If significant valvular heart disease, will consult cardiology inpatient.  Otherwise she is interested to establish care with [facility] cardiology group.  Hypertension: Blood pressures elevated. At home on amlodipine  10 mg, hydrochlorothiazide 12.5 mg, lisinopril 40 mg daily and Bystolic 5 mg daily. Once ejection fraction available, will likely convert to losartan in the hope of doing Entresto in the future.  Already on Bystolic.  Paroxysmal A-fib on Eliquis , sinus rhythm.  Therapeutic on Eliquis .  Rate controlled on Nebivolol.   Chronic anxiety and depression: On Lexapro  and buspirone .  Polyneuropathy: On gabapentin .  Hyperlipidemia, on Crestor .  GERD, on PPI.   DVT prophylaxis:  apixaban  (ELIQUIS ) tablet 5 mg   Code Status: Full code Family Communication: None at the bedside Disposition Plan:  Status is: Inpatient Remains inpatient appropriate because: IV diuresis, significant symptoms     Consultants:  None  Procedures:  None  Antimicrobials:  None     Objective: Vitals:   02/29/24 2209 02/29/24 2309 03/01/24 0153 03/01/24 0541  BP: (!) 179/65 (!) 159/63 (!) 171/54 (!) 159/57  Pulse: (!) 54 (!) 56 (!) 57   Resp: 18  18 18   Temp: 97.7 F (36.5 C)  97.6 F (36.4 C) 97.6 F (36.4 C)  TempSrc:      SpO2: 97% 97% 93% 96%  Weight:      Height:        Intake/Output Summary (Last 24 hours) at 03/01/2024 0713 Last data filed at 03/01/2024 0544 Gross per 24 hour  Intake 120 ml  Output 3800 ml  Net -3680 ml   Filed Weights   02/29/24 1423  Weight: 71.2 kg    Examination:  General: Looks fairly comfortable.  On room air. Cardiovascular: S1-S2 normal.  Regular rate rhythm.  Faint pansystolic murmur. Respiratory: Bilateral clear.  No added sounds. Gastrointestinal:.  Nontender. Ext: Trace pedal edema bilateral.  Hyperesthesia present.      Data Reviewed: I have personally reviewed following labs and imaging studies  CBC: Recent Labs  Lab 02/29/24 1426  WBC 8.6  HGB 11.5*  HCT 35.7*  MCV 85.2  PLT 282   Basic Metabolic Panel: Recent Labs  Lab 02/29/24 1426  NA 136  K 4.1  CL 100  CO2 22  GLUCOSE 91  BUN 12  CREATININE 0.76  CALCIUM  9.5   GFR: Estimated Creatinine Clearance: 45.7 mL/min (by C-G formula based on SCr of 0.76 mg/dL). Liver Function Tests: No results for input(s): "AST", "ALT", "ALKPHOS", "  BILITOT", "PROT", "ALBUMIN" in the last 168 hours. No results for input(s): "LIPASE", "AMYLASE" in the last 168 hours. No results for input(s): "AMMONIA" in the last 168 hours. Coagulation Profile: No results for input(s): "INR", "PROTIME" in the last 168 hours. Cardiac Enzymes: No results for input(s): "CKTOTAL", "CKMB", "CKMBINDEX", "TROPONINI" in the last 168 hours. BNP (last 3 results) Recent Labs    02/29/24 1426  PROBNP  2,848.0*   HbA1C: No results for input(s): "HGBA1C" in the last 72 hours. CBG: No results for input(s): "GLUCAP" in the last 168 hours. Lipid Profile: No results for input(s): "CHOL", "HDL", "LDLCALC", "TRIG", "CHOLHDL", "LDLDIRECT" in the last 72 hours. Thyroid Function Tests: No results for input(s): "TSH", "T4TOTAL", "FREET4", "T3FREE", "THYROIDAB" in the last 72 hours. Anemia Panel: No results for input(s): "VITAMINB12", "FOLATE", "FERRITIN", "TIBC", "IRON", "RETICCTPCT" in the last 72 hours. Sepsis Labs: No results for input(s): "PROCALCITON", "LATICACIDVEN" in the last 168 hours.  No results found for this or any previous visit (from the past 240 hours).       Radiology Studies: CT Head Wo Contrast Result Date: 02/29/2024 CLINICAL DATA:  Headache, increasing frequency or severity. EXAM: CT HEAD WITHOUT CONTRAST TECHNIQUE: Contiguous axial images were obtained from the base of the skull through the vertex without intravenous contrast. RADIATION DOSE REDUCTION: This exam was performed according to the departmental dose-optimization program which includes automated exposure control, adjustment of the mA and/or kV according to patient size and/or use of iterative reconstruction technique. COMPARISON:  None Available. FINDINGS: Brain: No acute intracranial hemorrhage. No CT evidence of acute infarct. Nonspecific hypoattenuation in the periventricular and subcortical white matter favored to reflect chronic microvascular ischemic changes. Possible small remote lacunar infarcts in the right corona radiata. Mild parenchymal volume loss. No edema, mass effect, or midline shift. The basilar cisterns are patent. Ventricles: The ventricles are normal. Vascular: Atherosclerotic calcifications of the carotid siphons and intracranial vertebral arteries. No hyperdense vessel. Skull: No acute or aggressive finding. Orbits: Bilateral lens replacement. Sinuses: Mucosal thickening in the right maxillary  sinus with thickening of the sinus walls suggestive of mucoperiosteal reaction. Additional mucosal thickening in the right frontal sinus. Other: Mastoid air cells are clear. IMPRESSION: No CT evidence of acute intracranial abnormality. Chronic microvascular ischemic changes. Possible small remote lacunar infarcts in the right corona radiata. Mild parenchymal volume loss. Chronic right maxillary sinusitis. Electronically Signed   By: Denny Flack M.D.   On: 02/29/2024 19:26   DG Chest 2 View Result Date: 02/29/2024 CLINICAL DATA:  Shortness of breath. EXAM: CHEST - 2 VIEW COMPARISON:  02/11/2020 FINDINGS: Mild cardiomegaly. Mild bibasilar scarring also stable. No evidence of acute infiltrate or edema. No pleural effusion. IMPRESSION: Mild cardiomegaly and bibasilar scarring. No active lung disease. Electronically Signed   By: Marlyce Sine M.D.   On: 02/29/2024 17:02        Scheduled Meds:  apixaban   5 mg Oral BID   busPIRone   10 mg Oral BID   escitalopram   20 mg Oral QHS   furosemide   20 mg Intravenous BID   gabapentin   100 mg Oral TID   nebivolol  2.5 mg Oral Daily   pantoprazole   40 mg Oral BID   rosuvastatin   5 mg Oral QHS   Continuous Infusions:   LOS: 1 day    Time spent: 55 minutes    Vada Garibaldi, MD [facility]

## 2024-03-01 NOTE — Progress Notes (Signed)
  Echocardiogram 2D Echocardiogram has been performed.  Toni Wells 03/01/2024, 8:51 AM

## 2024-03-01 NOTE — Evaluation (Signed)
 Physical Therapy One Time Evaluation Patient Details Name: Toni Wells MRN: [id number] DOB: 08/12/1932 Today's Date: 03/01/2024  History of Present Illness  89 year old female admitted with CHF exacerbation. Past medical history of paroxysmal A-fib on Eliquis , uncontrolled hypertension, arthritis  Clinical Impression  Patient evaluated by Physical Therapy with no further acute PT needs identified. All education has been completed and the patient has no further questions.  Pt independent and active at baseline.  Pt has been ambulating in room but agreeable to ambulate in hallway farther distance.  SpO2 95% on room air during ambulation.  Pt reports she is currently active at [employer] 2x/week with physical therapy.  No further follow-up Physical Therapy or equipment needs. PT is signing off. Thank you for this referral.         If plan is discharge home, recommend the following:     Can travel by private vehicle        Equipment Recommendations None recommended by PT  Recommendations for Other Services       Functional Status Assessment Patient has not had a recent decline in their functional status     Precautions / Restrictions Precautions Precautions: None      Mobility  Bed Mobility Overal bed mobility: Modified Independent                  Transfers Overall transfer level: Needs assistance Equipment used: None Transfers: Sit to/from Stand Sit to Stand: Supervision                Ambulation/Gait Ambulation/Gait assistance: Supervision Gait Distance (Feet): 400 Feet Assistive device: None Gait Pattern/deviations: Step-to pattern, Decreased stride length       General Gait Details: occasionally with narrow BOS and mildly unsteady however no physical assist required, cautious with floor change in hallway; Spo2 95% on room air  Stairs            Wheelchair Mobility     Tilt Bed    Modified Rankin (Stroke Patients Only)       Balance  Overall balance assessment: Mild deficits observed, not formally tested (however denies any recent falls)                                           Pertinent Vitals/Pain Pain Assessment Pain Assessment: No/denies pain    Home Living Family/patient expects to be discharged to:: Private residence Living Arrangements: Alone   Type of Home: House         Home Layout: One level Home Equipment: None      Prior Function Prior Level of Function : Independent/Modified Independent             Mobility Comments: goes to [employer] to "work out" with physical thearpy twice a week (reports she has been doing this for years)       Extremity/Trunk Assessment   Upper Extremity Assessment Upper Extremity Assessment: Overall WFL for tasks assessed    Lower Extremity Assessment Lower Extremity Assessment: Overall WFL for tasks assessed    Cervical / Trunk Assessment Cervical / Trunk Assessment: Normal  Communication   Communication Communication: No apparent difficulties    Cognition Arousal: Alert Behavior During Therapy: WFL for tasks assessed/performed   PT - Cognitive impairments: No apparent impairments  Following commands: Intact       Cueing       General Comments      Exercises     Assessment/Plan    PT Assessment Patient does not need any further PT services  PT Problem List         PT Treatment Interventions      PT Goals (Current goals can be found in the Care Plan section)  Acute Rehab PT Goals PT Goal Formulation: All assessment and education complete, DC therapy    Frequency       Co-evaluation               AM-PAC PT "6 Clicks" Mobility  Outcome Measure Help needed turning from your back to your side while in a flat bed without using bedrails?: None Help needed moving from lying on your back to sitting on the side of a flat bed without using bedrails?: None Help needed moving  to and from a bed to a chair (including a wheelchair)?: None Help needed standing up from a chair using your arms (e.g., wheelchair or bedside chair)?: None Help needed to walk in hospital room?: None Help needed climbing 3-5 steps with a railing? : None 6 Click Score: 24    End of Session Equipment Utilized During Treatment: Gait belt Activity Tolerance: Patient tolerated treatment well Patient left: in chair;with call bell/phone within reach Nurse Communication: Mobility status PT Visit Diagnosis: Difficulty in walking, not elsewhere classified (R26.2)    Time: 1914-7829 PT Time Calculation (min) (ACUTE ONLY): 18 min   Charges:   PT Evaluation $PT Eval Low Complexity: 1 Low   PT General Charges $$ ACUTE PT VISIT: 1 Visit        Toni Wells PT, DPT Physical Therapist Acute Rehabilitation Services Office: [phone]   Toni Wells 03/01/2024, 1:14 PM

## 2024-03-02 ENCOUNTER — Emergency Department (HOSPITAL_COMMUNITY)

## 2024-03-02 ENCOUNTER — Other Ambulatory Visit: Payer: Self-pay

## 2024-03-02 ENCOUNTER — Emergency Department (HOSPITAL_COMMUNITY)
Admission: EM | Admit: 2024-03-02 | Discharge: 2024-03-02 | Disposition: A | Discharge status: Home / Self Care | Attending: Emergency Medicine | Admitting: Emergency Medicine

## 2024-03-02 DIAGNOSIS — I509 Heart failure, unspecified: Secondary | ICD-10-CM | POA: Diagnosis not present

## 2024-03-02 DIAGNOSIS — Z7901 Long term (current) use of anticoagulants: Secondary | ICD-10-CM | POA: Insufficient documentation

## 2024-03-02 DIAGNOSIS — Z79899 Other long term (current) drug therapy: Secondary | ICD-10-CM | POA: Diagnosis not present

## 2024-03-02 DIAGNOSIS — Z8679 Personal history of other diseases of the circulatory system: Secondary | ICD-10-CM

## 2024-03-02 DIAGNOSIS — I110 Hypertensive heart disease with heart failure: Secondary | ICD-10-CM | POA: Insufficient documentation

## 2024-03-02 DIAGNOSIS — I342 Nonrheumatic mitral (valve) stenosis: Secondary | ICD-10-CM

## 2024-03-02 DIAGNOSIS — I10 Essential (primary) hypertension: Secondary | ICD-10-CM

## 2024-03-02 DIAGNOSIS — R079 Chest pain, unspecified: Secondary | ICD-10-CM | POA: Diagnosis present

## 2024-03-02 DIAGNOSIS — E876 Hypokalemia: Secondary | ICD-10-CM | POA: Insufficient documentation

## 2024-03-02 DIAGNOSIS — I5031 Acute diastolic (congestive) heart failure: Secondary | ICD-10-CM

## 2024-03-02 DIAGNOSIS — R030 Elevated blood-pressure reading, without diagnosis of hypertension: Secondary | ICD-10-CM

## 2024-03-02 DIAGNOSIS — R072 Precordial pain: Secondary | ICD-10-CM

## 2024-03-02 LAB — CBC
HCT: 41.4 % (ref 36.0–46.0)
Hemoglobin: 13 g/dL (ref 12.0–15.0)
MCH: 27.5 pg (ref 26.0–34.0)
MCHC: 31.4 g/dL (ref 30.0–36.0)
MCV: 87.5 fL (ref 80.0–100.0)
Platelets: 345 10*3/uL (ref 150–400)
RBC: 4.73 MIL/uL (ref 3.87–5.11)
RDW: 14.2 % (ref 11.5–15.5)
WBC: 10.4 10*3/uL (ref 4.0–10.5)
nRBC: 0 % (ref 0.0–0.2)

## 2024-03-02 LAB — BASIC METABOLIC PANEL WITH GFR
Anion gap: 10 (ref 5–15)
BUN: 20 mg/dL (ref 8–23)
CO2: 24 mmol/L (ref 22–32)
Calcium: 9.3 mg/dL (ref 8.9–10.3)
Chloride: 99 mmol/L (ref 98–111)
Creatinine, Ser: 0.75 mg/dL (ref 0.44–1.00)
GFR, Estimated: >60 mL/min (ref >60)
Glucose, Bld: 148 mg/dL — ABNORMAL HIGH (ref 70–99)
Potassium: 3.4 mmol/L — ABNORMAL LOW (ref 3.5–5.1)
Sodium: 133 mmol/L — ABNORMAL LOW (ref 135–145)

## 2024-03-02 LAB — TROPONIN I (HIGH SENSITIVITY): Troponin I (High Sensitivity): 10 ng/L (ref <18)

## 2024-03-02 MED ORDER — LOSARTAN POTASSIUM 50 MG PO TABS
50.0000 mg | ORAL_TABLET | Freq: Every day | ORAL | Status: DC
  Administered 2024-03-02: 50 mg via ORAL
  Filled 2024-03-02: qty 1

## 2024-03-02 MED ORDER — FAMOTIDINE 20 MG PO TABS
20.0000 mg | ORAL_TABLET | Freq: Once | ORAL | Status: AC
  Administered 2024-03-02: 20 mg via ORAL
  Filled 2024-03-02: qty 1

## 2024-03-02 MED ORDER — FUROSEMIDE 20 MG PO TABS: 20.0000 mg | ORAL_TABLET | Freq: Every day | ORAL | 2 refills | Status: DC

## 2024-03-02 MED ORDER — FUROSEMIDE 20 MG PO TABS
20.0000 mg | ORAL_TABLET | Freq: Two times a day (BID) | ORAL | Status: DC
  Administered 2024-03-02: 20 mg via ORAL
  Filled 2024-03-02: qty 1

## 2024-03-02 MED ORDER — ACETAMINOPHEN 325 MG PO TABS
650.0000 mg | ORAL_TABLET | Freq: Once | ORAL | Status: AC
  Administered 2024-03-02: 650 mg via ORAL
  Filled 2024-03-02: qty 2

## 2024-03-02 MED ORDER — LOSARTAN POTASSIUM 50 MG PO TABS: 50.0000 mg | ORAL_TABLET | Freq: Every day | ORAL | 2 refills | Status: DC

## 2024-03-02 MED ORDER — POTASSIUM CHLORIDE 20 MEQ PO PACK: 40.0000 meq | PACK | Freq: Two times a day (BID) | ORAL | Status: DC

## 2024-03-02 MED ORDER — ALUM & MAG HYDROXIDE-SIMETH 200-200-20 MG/5ML PO SUSP
30.0000 mL | Freq: Once | ORAL | Status: AC
  Administered 2024-03-02: 30 mL via ORAL
  Filled 2024-03-02: qty 30

## 2024-03-02 NOTE — Plan of Care (Signed)

## 2024-03-02 NOTE — Discharge Instructions (Addendum)
 It was our pleasure to provide your ER care today - we hope that you feel better.  For recent chest pain, follow up closely with cardiologist in the next 1-2 weeks - we made referral, and they should be contacting you with an appointment in the next few days.   From today's labs, your potassium level is mildly low - eat plenty of fruits and vegetables, and follow up with primary care doctor.   Return to ER right away if worse, new symptoms, fevers, recurrent/persistent chest pain, increased trouble breathing, or other concern.

## 2024-03-02 NOTE — TOC Progression Note (Signed)
 Transition of Care [facility]) - Progression Note    Patient Details  Name: Toni Wells MRN: [id number] Date of Birth: 1931-10-21  Transition of Care [facility]) CM/SW Contact  Levie Ream, RN Phone Number: 03/02/2024, 10:57 AM  Clinical Narrative:    Pt discharged before [facility] assessment completed; no PT recc noted.        Expected Discharge Plan and Services         Expected Discharge Date: 03/02/24                                     Social Determinants of Health (SDOH) Interventions SDOH Screenings   Food Insecurity: No Food Insecurity (02/29/2024)  Housing: Low Risk  (03/01/2024)  Transportation Needs: No Transportation Needs (02/29/2024)  Utilities: Not At Risk (02/29/2024)  Financial Resource Strain: Low Risk  (10/09/2023)   Received from [facility]  Physical Activity: Insufficiently Active (03/22/2023)   Received from [facility]  Social Connections: Socially Isolated (02/29/2024)  Stress: No Stress Concern Present (03/22/2023)   Received from [facility]  Tobacco Use: Low Risk  (03/01/2024)    Readmission Risk Interventions     No data to display

## 2024-03-02 NOTE — Progress Notes (Signed)
 OT Cancellation Note  Patient Details Name: Toni Wells MRN: [id number] DOB: Feb 15, 1932   Cancelled Treatment:    Reason Eval/Treat Not Completed: OT screened, no needs identified, will sign off. Per chart review, pt appears to be at baseline with ADLs.   Lael Pierce, OT Acute Rehabilitation Services Office: [phone]   Brinton Canavan 03/02/2024, 9:25 AM

## 2024-03-02 NOTE — ED Triage Notes (Signed)
 Pt was d/c from hospital this AM, was prescribed Losartan and Lasix . Pt states she was sitting outside and began to have a throbbing pain in her chest that won't go away.

## 2024-03-02 NOTE — ED Provider Notes (Signed)
 [place] EMERGENCY DEPARTMENT AT [facility] Provider Note   CSN: [id number] Arrival date & time: 03/02/24  1550     History  Chief Complaint  Patient presents with   Chest Pain    Toni Wells is a 89 y.o. female.  Pt with c/o localized chest pain to small area, few cm diameter to midline/lower sternal area, non radiating, non pleuritic, dull. Indicates periodically gets other similar localized pain to chest, but is brief, at rest. Today's pain was also at rest, but more prolonged. No exertional chest pain. No associated sob or unusual doe, nv or diaphoresis. Denies chest wall injury or strain. Had just been d/c'd from hospital today with chf exacerbation - felt fine then.  Denies other recent chest pain, no exertional chest pain. No no heartburn. No back or flank pain. No neck pain. No abd pain.   The history is provided by the patient and medical records.  Chest Pain Associated symptoms: no abdominal pain, no back pain, no cough, no fever, no headache, no nausea, no palpitations, no shortness of breath and no vomiting        Home Medications Prior to Admission medications   Medication Sig Start Date End Date Taking? Authorizing Provider  apixaban  (ELIQUIS ) 5 MG TABS tablet Take 5 mg by mouth 2 (two) times daily.    [provider]  busPIRone  (BUSPAR ) 10 MG tablet Take 20 mg by mouth 2 (two) times daily. 06/16/21   [provider]  escitalopram  (LEXAPRO ) 20 MG tablet Take 30 mg by mouth daily.    [provider]  fluticasone (FLONASE) 50 MCG/ACT nasal spray Place 1 spray into both nostrils daily as needed for allergies. 01/03/19   [provider]  furosemide  (LASIX ) 20 MG tablet Take 1 tablet (20 mg total) by mouth daily. 03/02/24   Vada Garibaldi, MD  gabapentin  (NEURONTIN ) 100 MG capsule Take 100 mg by mouth 2 (two) times daily. 02/11/18   [provider]  losartan (COZAAR) 50 MG tablet Take 1 tablet (50 mg total) by mouth  daily. 03/02/24 04/01/24  Ghimire, Kuber, MD  Melatonin 10 MG TABS Take 10 mg by mouth at bedtime.    [provider]  nebivolol (BYSTOLIC) 5 MG tablet Take 5 mg by mouth at bedtime.    [provider]  omeprazole (PRILOSEC) 20 MG capsule Take 20 mg by mouth 2 (two) times daily.    [provider]  rosuvastatin  (CRESTOR ) 5 MG tablet Take 1 tablet by mouth at bedtime. 05/14/20   [provider]  triamcinolone cream (KENALOG) 0.1 % Apply 1 Application topically.    [provider]      Allergies    Hydrochlorothiazide, Cat dander, and Cephalexin    Review of Systems   Review of Systems  Constitutional:  Negative for fever.  Respiratory:  Negative for cough and shortness of breath.   Cardiovascular:  Positive for chest pain. Negative for palpitations and leg swelling.  Gastrointestinal:  Negative for abdominal pain, nausea and vomiting.  Genitourinary:  Negative for flank pain.  Musculoskeletal:  Negative for back pain and neck pain.  Neurological:  Negative for headaches.    Physical Exam Updated Vital Signs BP (!) 161/69 (BP Location: Right Arm)   Pulse (!) 54   Temp 98 F (36.7 C) (Oral)   Resp 16   SpO2 98%  Physical Exam Vitals and nursing note reviewed.  Constitutional:      Appearance: Normal appearance. She is well-developed.  HENT:     Head: Atraumatic.     Nose: Nose normal.     Mouth/Throat:     Mouth: Mucous membranes are moist.  Eyes:     General: No scleral icterus.    Conjunctiva/sclera: Conjunctivae normal.  Neck:     Trachea: No tracheal deviation.  Cardiovascular:     Rate and Rhythm: Normal rate and regular rhythm.     Pulses: Normal pulses.     Heart sounds: Normal heart sounds. No murmur heard.    No friction rub. No gallop.  Pulmonary:     Effort: Pulmonary effort is normal. No respiratory distress.     Breath sounds: Normal breath sounds.  Chest:     Chest wall: No tenderness.  Abdominal:     General:  There is no distension.     Palpations: Abdomen is soft.     Tenderness: There is no abdominal tenderness.  Genitourinary:    Comments:   Musculoskeletal:        General: No swelling or tenderness.     Cervical back: Neck supple. No muscular tenderness.     Right lower leg: No edema.     Left lower leg: No edema.  Skin:    General: Skin is warm and dry.     Findings: No rash.     Comments: No skin changes, lesions or sts in area of pain.   Neurological:     Mental Status: She is alert.     Comments: Alert, speech normal.   Psychiatric:        Mood and Affect: Mood normal.     ED Results / Procedures / Treatments   Labs (all labs ordered are listed, but only abnormal results are displayed) Results for orders placed or performed during the hospital encounter of 03/02/24  Basic metabolic panel   Collection Time: 03/02/24  4:11 PM  Result Value Ref Range   Sodium 133 (L) 135 - 145 mmol/L   Potassium 3.4 (L) 3.5 - 5.1 mmol/L   Chloride 99 98 - 111 mmol/L   CO2 24 22 - 32 mmol/L   Glucose, Bld 148 (H) 70 - 99 mg/dL   BUN 20 8 - 23 mg/dL   Creatinine, Ser 0.27 0.44 - 1.00 mg/dL   Calcium  9.3 8.9 - 10.3 mg/dL   GFR, Estimated >25 >36 mL/min   Anion gap 10 5 - 15  CBC   Collection Time: 03/02/24  4:11 PM  Result Value Ref Range   WBC 10.4 4.0 - 10.5 K/uL   RBC 4.73 3.87 - 5.11 MIL/uL   Hemoglobin 13.0 12.0 - 15.0 g/dL   HCT 64.4 03.4 - 74.2 %   MCV 87.5 80.0 - 100.0 fL   MCH 27.5 26.0 - 34.0 pg   MCHC 31.4 30.0 - 36.0 g/dL   RDW 59.5 63.8 - 75.6 %   Platelets 345 150 - 400 K/uL   nRBC 0.0 0.0 - 0.2 %  Troponin I (High Sensitivity)   Collection Time: 03/02/24  4:11 PM  Result Value Ref Range   Troponin I (High Sensitivity) 10 <18 ng/L   DG Chest 2 View Result Date: 03/02/2024 CLINICAL DATA:  Chest pain EXAM: CHEST - 2 VIEW COMPARISON:  Chest x-ray 02/29/2024 FINDINGS: The heart is mildly enlarged, unchanged. A there is no focal lung infiltrate, pleural effusion or  pneumothorax. The visualized skeletal structures are unremarkable. IMPRESSION: Mild cardiomegaly. No active cardiopulmonary disease. Electronically Signed   By: Rollen Clines.D.  On: 03/02/2024 16:49   ECHOCARDIOGRAM COMPLETE Result Date: 03/01/2024    ECHOCARDIOGRAM REPORT   Patient Name:   Toni Wells Date of Exam: 03/01/2024 Medical Rec #:  [id number]   Height:       64.5 in Accession #:    [id number]  Weight:       157.0 lb Date of Birth:  December 09, 1931    BSA:          1.775 m Patient Age:    89 years    BP:           159/57 mmHg Patient Gender: F           HR:           52 bpm. Exam Location:  Inpatient Procedure: 2D Echo, Cardiac Doppler and Color Doppler (Both Spectral and Color            Flow Doppler were utilized during procedure). Indications:    I50.40* Unspecified combined systolic (congestive) and diastolic                 (congestive) heart failure  History:        Patient has no prior history of Echocardiogram examinations.                 CHF; Risk Factors:Hypertension, Dyslipidemia and Sleep Apnea.  Sonographer:    Raynelle Callow RDCS Referring Phys: [id number] CAROLE N HALL IMPRESSIONS  1. Left ventricular ejection fraction, by estimation, is 60 to 65%. The left ventricle has normal function. The left ventricle has no regional wall motion abnormalities. Left ventricular diastolic parameters are indeterminate.  2. Right ventricular systolic function is mildly reduced. The right ventricular size is mildly enlarged. There is moderately elevated pulmonary artery systolic pressure.  3. Left atrial size was moderately dilated.  4. Right atrial size was moderately dilated.  5. The mitral valve is abnormal. Moderate mitral valve regurgitation. No evidence of mitral stenosis. Moderate mitral annular calcification.  6. The tricuspid valve is abnormal. Tricuspid valve regurgitation is moderate.  7. The aortic valve is tricuspid. There is moderate calcification of the aortic valve. There is moderate thickening of  the aortic valve. Aortic valve regurgitation is not visualized. No aortic stenosis is present.  8. The inferior vena cava is normal in size with greater than 50% respiratory variability, suggesting right atrial pressure of 3 mmHg. FINDINGS  Left Ventricle: Left ventricular ejection fraction, by estimation, is 60 to 65%. The left ventricle has normal function. The left ventricle has no regional wall motion abnormalities. The left ventricular internal cavity size was normal in size. There is  no left ventricular hypertrophy. Left ventricular diastolic parameters are indeterminate. Right Ventricle: The right ventricular size is mildly enlarged. Right vetricular wall thickness was not well visualized. Right ventricular systolic function is mildly reduced. There is moderately elevated pulmonary artery systolic pressure. The tricuspid  regurgitant velocity is 3.61 m/s, and with an assumed right atrial pressure of 3 mmHg, the estimated right ventricular systolic pressure is 55.1 mmHg. Left Atrium: Left atrial size was moderately dilated. Right Atrium: Right atrial size was moderately dilated. Pericardium: There is no evidence of pericardial effusion. Mitral Valve: MR vena contracta is 0.5 cm. The mitral valve is abnormal. There is moderate thickening of the mitral valve leaflet(s). There is moderate calcification of the mitral valve leaflet(s). Moderate mitral annular calcification. Moderate mitral valve regurgitation. No evidence of mitral valve stenosis. MV peak gradient, 8.2 mmHg. The mean mitral valve gradient is  2.0 mmHg. Tricuspid Valve: The tricuspid valve is abnormal. Tricuspid valve regurgitation is moderate . No evidence of tricuspid stenosis. Aortic Valve: The aortic valve is tricuspid. There is moderate calcification of the aortic valve. There is moderate thickening of the aortic valve. There is moderate aortic valve annular calcification. Aortic valve regurgitation is not visualized. No aortic stenosis is  present. Aortic valve mean gradient measures 7.8 mmHg. Aortic valve peak gradient measures 15.7 mmHg. Aortic valve area, by VTI measures 1.93 cm. Pulmonic Valve: The pulmonic valve was not well visualized. Pulmonic valve regurgitation is mild. No evidence of pulmonic stenosis. Aorta: The aortic root and ascending aorta are structurally normal, with no evidence of dilitation. Venous: The inferior vena cava is normal in size with greater than 50% respiratory variability, suggesting right atrial pressure of 3 mmHg. IAS/Shunts: No atrial level shunt detected by color flow Doppler.  LEFT VENTRICLE PLAX 2D LVIDd:         4.50 cm LVIDs:         2.90 cm LV PW:         0.90 cm LV IVS:        1.00 cm LVOT diam:     2.00 cm LV SV:         94 LV SV Index:   53 LVOT Area:     3.14 cm  LV Volumes (MOD) LV vol d, MOD A2C: 76.7 ml LV vol d, MOD A4C: 83.8 ml LV vol s, MOD A2C: 29.0 ml LV vol s, MOD A4C: 30.3 ml LV SV MOD A2C:     47.7 ml LV SV MOD A4C:     83.8 ml LV SV MOD BP:      52.2 ml RIGHT VENTRICLE            IVC RV S prime:     9.03 cm/s  IVC diam: 1.90 cm TAPSE (M-mode): 1.7 cm LEFT ATRIUM             Index        RIGHT ATRIUM           Index LA diam:        3.80 cm 2.14 cm/m   RA Area:     23.60 cm LA Vol (A2C):   69.4 ml 39.11 ml/m  RA Volume:   73.80 ml  41.59 ml/m LA Vol (A4C):   75.8 ml 42.72 ml/m LA Biplane Vol: 73.2 ml 41.25 ml/m  AORTIC VALVE                     PULMONIC VALVE AV Area (Vmax):    1.94 cm      PR End Diast Vel: 2.20 msec AV Area (Vmean):   1.98 cm AV Area (VTI):     1.93 cm AV Vmax:           198.40 cm/s AV Vmean:          124.000 cm/s AV VTI:            0.487 m AV Peak Grad:      15.7 mmHg AV Mean Grad:      7.8 mmHg LVOT Vmax:         122.50 cm/s LVOT Vmean:        78.150 cm/s LVOT VTI:          0.298 m LVOT/AV VTI ratio: 0.61  AORTA Ao Root diam: 3.20 cm Ao Asc diam:  3.00 cm MITRAL VALVE  TRICUSPID VALVE MV Area (PHT): 5.13 cm       TR Peak grad:   52.1 mmHg MV Area  VTI:   2.11 cm       TR Vmax:        361.00 cm/s MV Peak grad:  8.2 mmHg MV Mean grad:  2.0 mmHg       SHUNTS MV Vmax:       1.43 m/s       Systemic VTI:  0.30 m MV Vmean:      55.6 cm/s      Systemic Diam: 2.00 cm MV Decel Time: 148 msec MR Peak grad:    104.4 mmHg MR Mean grad:    73.0 mmHg MR Vmax:         511.00 cm/s MR Vmean:        408.0 cm/s MR PISA:         2.26 cm MR PISA Eff ROA: 17 mm MR PISA Radius:  0.60 cm MV E velocity: 118.00 cm/s Armida Lander MD Electronically signed by Armida Lander MD Signature Date/Time: 03/01/2024/6:52:46 PM    Final    CT Head Wo Contrast Result Date: 02/29/2024 CLINICAL DATA:  Headache, increasing frequency or severity. EXAM: CT HEAD WITHOUT CONTRAST TECHNIQUE: Contiguous axial images were obtained from the base of the skull through the vertex without intravenous contrast. RADIATION DOSE REDUCTION: This exam was performed according to the departmental dose-optimization program which includes automated exposure control, adjustment of the mA and/or kV according to patient size and/or use of iterative reconstruction technique. COMPARISON:  None Available. FINDINGS: Brain: No acute intracranial hemorrhage. No CT evidence of acute infarct. Nonspecific hypoattenuation in the periventricular and subcortical white matter favored to reflect chronic microvascular ischemic changes. Possible small remote lacunar infarcts in the right corona radiata. Mild parenchymal volume loss. No edema, mass effect, or midline shift. The basilar cisterns are patent. Ventricles: The ventricles are normal. Vascular: Atherosclerotic calcifications of the carotid siphons and intracranial vertebral arteries. No hyperdense vessel. Skull: No acute or aggressive finding. Orbits: Bilateral lens replacement. Sinuses: Mucosal thickening in the right maxillary sinus with thickening of the sinus walls suggestive of mucoperiosteal reaction. Additional mucosal thickening in the right frontal sinus. Other:  Mastoid air cells are clear. IMPRESSION: No CT evidence of acute intracranial abnormality. Chronic microvascular ischemic changes. Possible small remote lacunar infarcts in the right corona radiata. Mild parenchymal volume loss. Chronic right maxillary sinusitis. Electronically Signed   By: Denny Flack M.D.   On: 02/29/2024 19:26   DG Chest 2 View Result Date: 02/29/2024 CLINICAL DATA:  Shortness of breath. EXAM: CHEST - 2 VIEW COMPARISON:  02/11/2020 FINDINGS: Mild cardiomegaly. Mild bibasilar scarring also stable. No evidence of acute infiltrate or edema. No pleural effusion. IMPRESSION: Mild cardiomegaly and bibasilar scarring. No active lung disease. Electronically Signed   By: Marlyce Sine M.D.   On: 02/29/2024 17:02    EKG EKG Interpretation Date/Time:  Sunday March 02 2024 16:01:02 EDT Ventricular Rate:  53 PR Interval:  170 QRS Duration:  90 QT Interval:  490 QTC Calculation: 461 R Axis:   -67  Text Interpretation: Sinus rhythm Atrial premature complexes Left anterior fascicular block Non-specific ST-t changes No significant change since last tracing Confirmed by Guadalupe Lee ([id number]) on 03/02/2024 6:26:42 PM  Radiology DG Chest 2 View Result Date: 03/02/2024 CLINICAL DATA:  Chest pain EXAM: CHEST - 2 VIEW COMPARISON:  Chest x-ray 02/29/2024 FINDINGS: The heart is mildly enlarged, unchanged. A there is no  focal lung infiltrate, pleural effusion or pneumothorax. The visualized skeletal structures are unremarkable. IMPRESSION: Mild cardiomegaly. No active cardiopulmonary disease. Electronically Signed   By: Tyron Gallon M.D.   On: 03/02/2024 16:49   ECHOCARDIOGRAM COMPLETE Result Date: 03/01/2024    ECHOCARDIOGRAM REPORT   Patient Name:   Toni Wells Date of Exam: 03/01/2024 Medical Rec #:  [id number]   Height:       64.5 in Accession #:    [id number]  Weight:       157.0 lb Date of Birth:  [birth year]    BSA:          1.775 m Patient Age:    89 years    BP:           159/57 mmHg Patient  Gender: F           HR:           52 bpm. Exam Location:  Inpatient Procedure: 2D Echo, Cardiac Doppler and Color Doppler (Both Spectral and Color            Flow Doppler were utilized during procedure). Indications:    I50.40* Unspecified combined systolic (congestive) and diastolic                 (congestive) heart failure  History:        Patient has no prior history of Echocardiogram examinations.                 CHF; Risk Factors:Hypertension, Dyslipidemia and Sleep Apnea.  Sonographer:    Raynelle Callow RDCS Referring Phys: [id number] CAROLE N HALL IMPRESSIONS  1. Left ventricular ejection fraction, by estimation, is 60 to 65%. The left ventricle has normal function. The left ventricle has no regional wall motion abnormalities. Left ventricular diastolic parameters are indeterminate.  2. Right ventricular systolic function is mildly reduced. The right ventricular size is mildly enlarged. There is moderately elevated pulmonary artery systolic pressure.  3. Left atrial size was moderately dilated.  4. Right atrial size was moderately dilated.  5. The mitral valve is abnormal. Moderate mitral valve regurgitation. No evidence of mitral stenosis. Moderate mitral annular calcification.  6. The tricuspid valve is abnormal. Tricuspid valve regurgitation is moderate.  7. The aortic valve is tricuspid. There is moderate calcification of the aortic valve. There is moderate thickening of the aortic valve. Aortic valve regurgitation is not visualized. No aortic stenosis is present.  8. The inferior vena cava is normal in size with greater than 50% respiratory variability, suggesting right atrial pressure of 3 mmHg. FINDINGS  Left Ventricle: Left ventricular ejection fraction, by estimation, is 60 to 65%. The left ventricle has normal function. The left ventricle has no regional wall motion abnormalities. The left ventricular internal cavity size was normal in size. There is  no left ventricular hypertrophy. Left ventricular  diastolic parameters are indeterminate. Right Ventricle: The right ventricular size is mildly enlarged. Right vetricular wall thickness was not well visualized. Right ventricular systolic function is mildly reduced. There is moderately elevated pulmonary artery systolic pressure. The tricuspid  regurgitant velocity is 3.61 m/s, and with an assumed right atrial pressure of 3 mmHg, the estimated right ventricular systolic pressure is 55.1 mmHg. Left Atrium: Left atrial size was moderately dilated. Right Atrium: Right atrial size was moderately dilated. Pericardium: There is no evidence of pericardial effusion. Mitral Valve: MR vena contracta is 0.5 cm. The mitral valve is abnormal. There is moderate thickening of the mitral valve leaflet(s). There is  moderate calcification of the mitral valve leaflet(s). Moderate mitral annular calcification. Moderate mitral valve regurgitation. No evidence of mitral valve stenosis. MV peak gradient, 8.2 mmHg. The mean mitral valve gradient is 2.0 mmHg. Tricuspid Valve: The tricuspid valve is abnormal. Tricuspid valve regurgitation is moderate . No evidence of tricuspid stenosis. Aortic Valve: The aortic valve is tricuspid. There is moderate calcification of the aortic valve. There is moderate thickening of the aortic valve. There is moderate aortic valve annular calcification. Aortic valve regurgitation is not visualized. No aortic stenosis is present. Aortic valve mean gradient measures 7.8 mmHg. Aortic valve peak gradient measures 15.7 mmHg. Aortic valve area, by VTI measures 1.93 cm. Pulmonic Valve: The pulmonic valve was not well visualized. Pulmonic valve regurgitation is mild. No evidence of pulmonic stenosis. Aorta: The aortic root and ascending aorta are structurally normal, with no evidence of dilitation. Venous: The inferior vena cava is normal in size with greater than 50% respiratory variability, suggesting right atrial pressure of 3 mmHg. IAS/Shunts: No atrial level  shunt detected by color flow Doppler.  LEFT VENTRICLE PLAX 2D LVIDd:         4.50 cm LVIDs:         2.90 cm LV PW:         0.90 cm LV IVS:        1.00 cm LVOT diam:     2.00 cm LV SV:         94 LV SV Index:   53 LVOT Area:     3.14 cm  LV Volumes (MOD) LV vol d, MOD A2C: 76.7 ml LV vol d, MOD A4C: 83.8 ml LV vol s, MOD A2C: 29.0 ml LV vol s, MOD A4C: 30.3 ml LV SV MOD A2C:     47.7 ml LV SV MOD A4C:     83.8 ml LV SV MOD BP:      52.2 ml RIGHT VENTRICLE            IVC RV S prime:     9.03 cm/s  IVC diam: 1.90 cm TAPSE (M-mode): 1.7 cm LEFT ATRIUM             Index        RIGHT ATRIUM           Index LA diam:        3.80 cm 2.14 cm/m   RA Area:     23.60 cm LA Vol (A2C):   69.4 ml 39.11 ml/m  RA Volume:   73.80 ml  41.59 ml/m LA Vol (A4C):   75.8 ml 42.72 ml/m LA Biplane Vol: 73.2 ml 41.25 ml/m  AORTIC VALVE                     PULMONIC VALVE AV Area (Vmax):    1.94 cm      PR End Diast Vel: 2.20 msec AV Area (Vmean):   1.98 cm AV Area (VTI):     1.93 cm AV Vmax:           198.40 cm/s AV Vmean:          124.000 cm/s AV VTI:            0.487 m AV Peak Grad:      15.7 mmHg AV Mean Grad:      7.8 mmHg LVOT Vmax:         122.50 cm/s LVOT Vmean:        78.150 cm/s LVOT VTI:  0.298 m LVOT/AV VTI ratio: 0.61  AORTA Ao Root diam: 3.20 cm Ao Asc diam:  3.00 cm MITRAL VALVE                  TRICUSPID VALVE MV Area (PHT): 5.13 cm       TR Peak grad:   52.1 mmHg MV Area VTI:   2.11 cm       TR Vmax:        361.00 cm/s MV Peak grad:  8.2 mmHg MV Mean grad:  2.0 mmHg       SHUNTS MV Vmax:       1.43 m/s       Systemic VTI:  0.30 m MV Vmean:      55.6 cm/s      Systemic Diam: 2.00 cm MV Decel Time: 148 msec MR Peak grad:    104.4 mmHg MR Mean grad:    73.0 mmHg MR Vmax:         511.00 cm/s MR Vmean:        408.0 cm/s MR PISA:         2.26 cm MR PISA Eff ROA: 17 mm MR PISA Radius:  0.60 cm MV E velocity: 118.00 cm/s Armida Lander MD Electronically signed by Armida Lander MD Signature Date/Time:  03/01/2024/6:52:46 PM    Final     Procedures Procedures    Medications Ordered in ED Medications  potassium chloride (KLOR-CON) packet 40 mEq (has no administration in time range)  acetaminophen  (TYLENOL ) tablet 650 mg (650 mg Oral Given 03/02/24 1707)  alum & mag hydroxide-simeth (MAALOX/MYLANTA) 200-200-20 MG/5ML suspension 30 mL (30 mLs Oral Given 03/02/24 1707)  famotidine (PEPCID) tablet 20 mg (20 mg Oral Given 03/02/24 1707)    ED Course/ Medical Decision Making/ A&P                                 Medical Decision Making Problems Addressed: Elevated blood pressure reading: acute illness or injury Essential hypertension: chronic illness or injury with exacerbation, progression, or side effects of treatment that poses a threat to life or bodily functions History of congestive heart failure: chronic illness or injury Hypokalemia: acute illness or injury Precordial chest pain: acute illness or injury with systemic symptoms that poses a threat to life or bodily functions  Amount and/or Complexity of Data Reviewed Independent Historian:     Details: Family, hx External Data Reviewed: notes. Labs: ordered. Decision-making details documented in ED Course. Radiology: ordered and independent interpretation performed. Decision-making details documented in ED Course.  Risk OTC drugs. Prescription drug management. Decision regarding hospitalization.   Iv ns. Continuous pulse ox and cardiac monitoring. Labs ordered/sent. Imaging ordered.   Differential diagnosis includes acs, msk cp, gi cp, etc. Dispo decision including potential need for admission considered - will get labs and imaging and reassess.   Reviewed nursing notes and prior charts for additional history. External reports reviewed.   Cardiac monitor: sinus rhythm, rate 60.  Acetaminophen  po, pepcid po, maalox po.   Labs reviewed/interpreted by me - k sl low, kcl po. Wbc and hgb normal. Trop normal.  Delta trop  pending.   Xrays reviewed/interpreted by me - no pna or edema.  RN indicates patient does not want 2nd troponin and is requesting d/c.   Discussed need for 2nd trop. Pt indicates is not having any chest pain or sob, and does not want 2nd trop, just  wants d/c to home. Will d/c per pt request.   Rec close pcp/cardiology f/u.  Return precautions provided.          Final Clinical Impression(s) / ED Diagnoses Final diagnoses:  None    Rx / DC Orders ED Discharge Orders     None         Guadalupe Lee, MD 03/02/24 [phone]

## 2024-03-02 NOTE — Discharge Summary (Signed)
 Physician Discharge Summary  Chantele Corado EAV:[id number] DOB: 20-Jan-1932 DOA: 02/29/2024  PCP: Benita Bramble, PA  Admit date: 02/29/2024 Discharge date: 03/02/2024  Admitted From: Home Disposition: Home  Recommendations for Outpatient Follow-up:  Follow up with PCP in 1-2 weeks Please obtain BMP/CBC in one week Sending referral to cardiology for follow-up  Home Health: N/A Equipment/Devices: N/A  Discharge Condition: Stable CODE STATUS: Full code Diet recommendation: Low-salt diet  Discharge summary: 89 year old with history of paroxysmal A-fib on Eliquis , uncontrolled hypertension presenting with progressively worsening shortness of breath and bilateral leg swelling for last few months.  In the emergency room found to have extremity edema, BNP 2800.  Given Lasix  and admitted as CHF exacerbation.  Hemodynamically stable on admission. Patient does have history of A-fib currently in sinus rhythm, previous ejection fraction 50% as done 2 years ago with mitral valve stenosis.  Acute diastolic heart failure likely secondary to valvular dysfunction: Patient was admitted and treated with IV Lasix .  Excellent symptom improvement and asymptomatic today.  Diuresed more than 5 L. Echocardiogram with normal ejection fraction, moderate mitral valve stenosis and moderate pulmonary hypertension. Since patient has adequately improved we will discharge patient on Lasix  20 mg daily Losartan 50 mg daily-changed from lisinopril 20 mg daily at home in case patient will benefit with Entresto as outpatient. Already on Nebivolol that she will continue. Recommended low-salt diet. Ambulatory referral to cardiology for follow-up.  Paroxysmal A-fib: Currently sinus rhythm.  Rate controlled on Nebivolol.  Therapeutic on Eliquis .  Hypertension: As above.  On lisinopril at home.  Discharging on Lasix , losartan.  Polyneuropathy: On gabapentin .  Hyperlipidemia: On Crestor .  GERD: On PPI.   Continue.  Medically stabilized today to discharge home with outpatient follow-up.  Discharge Diagnoses:  Principal Problem:   CHF (congestive heart failure) [facility])    Discharge Instructions  Discharge Instructions     Ambulatory referral to Cardiology   Complete by: As directed    Diet - low sodium heart healthy   Complete by: As directed    Increase activity slowly   Complete by: As directed       Allergies as of 03/02/2024       Reactions   Hydrochlorothiazide Other (See Comments)   Dizziness    Cat Dander Swelling   Swelling of eyes    Cephalexin Diarrhea, Other (See Comments)   Abdominal pain        Medication List     STOP taking these medications    lisinopril 10 MG tablet Commonly known as: ZESTRIL       TAKE these medications    busPIRone  10 MG tablet Commonly known as: BUSPAR  Take 20 mg by mouth 2 (two) times daily.   Eliquis  5 MG Tabs tablet Generic drug: apixaban  Take 5 mg by mouth 2 (two) times daily.   fluticasone 50 MCG/ACT nasal spray Commonly known as: FLONASE Place 1 spray into both nostrils daily as needed for allergies.   furosemide  20 MG tablet Commonly known as: LASIX  Take 1 tablet (20 mg total) by mouth daily.   gabapentin  100 MG capsule Commonly known as: NEURONTIN  Take 100 mg by mouth 2 (two) times daily.   Lexapro  20 MG tablet Generic drug: escitalopram  Take 30 mg by mouth daily.   losartan 50 MG tablet Commonly known as: COZAAR Take 1 tablet (50 mg total) by mouth daily.   Melatonin 10 MG Tabs Take 10 mg by mouth at bedtime.   nebivolol 5 MG tablet Commonly known as: BYSTOLIC  Take 5 mg by mouth at bedtime.   omeprazole 20 MG capsule Commonly known as: PRILOSEC Take 20 mg by mouth 2 (two) times daily.   rosuvastatin  5 MG tablet Commonly known as: CRESTOR  Take 1 tablet by mouth at bedtime.   triamcinolone cream 0.1 % Commonly known as: KENALOG Apply 1 Application topically.        Allergies   Allergen Reactions   Hydrochlorothiazide Other (See Comments)    Dizziness    Cat Dander Swelling    Swelling of eyes    Cephalexin Diarrhea and Other (See Comments)    Abdominal pain    Consultations: None   Procedures/Studies: ECHOCARDIOGRAM COMPLETE Result Date: 03/01/2024    ECHOCARDIOGRAM REPORT   Patient Name:   BUFFY EHLER Date of Exam: 03/01/2024 Medical Rec #:  [id number]   Height:       64.5 in Accession #:    [id number]  Weight:       157.0 lb Date of Birth:  10-Sep-1932    BSA:          1.775 m Patient Age:    89 years    BP:           159/57 mmHg Patient Gender: F           HR:           52 bpm. Exam Location:  Inpatient Procedure: 2D Echo, Cardiac Doppler and Color Doppler (Both Spectral and Color            Flow Doppler were utilized during procedure). Indications:    I50.40* Unspecified combined systolic (congestive) and diastolic                 (congestive) heart failure  History:        Patient has no prior history of Echocardiogram examinations.                 CHF; Risk Factors:Hypertension, Dyslipidemia and Sleep Apnea.  Sonographer:    Raynelle Callow RDCS Referring Phys: [id number] CAROLE N HALL IMPRESSIONS  1. Left ventricular ejection fraction, by estimation, is 60 to 65%. The left ventricle has normal function. The left ventricle has no regional wall motion abnormalities. Left ventricular diastolic parameters are indeterminate.  2. Right ventricular systolic function is mildly reduced. The right ventricular size is mildly enlarged. There is moderately elevated pulmonary artery systolic pressure.  3. Left atrial size was moderately dilated.  4. Right atrial size was moderately dilated.  5. The mitral valve is abnormal. Moderate mitral valve regurgitation. No evidence of mitral stenosis. Moderate mitral annular calcification.  6. The tricuspid valve is abnormal. Tricuspid valve regurgitation is moderate.  7. The aortic valve is tricuspid. There is moderate calcification of the aortic  valve. There is moderate thickening of the aortic valve. Aortic valve regurgitation is not visualized. No aortic stenosis is present.  8. The inferior vena cava is normal in size with greater than 50% respiratory variability, suggesting right atrial pressure of 3 mmHg. FINDINGS  Left Ventricle: Left ventricular ejection fraction, by estimation, is 60 to 65%. The left ventricle has normal function. The left ventricle has no regional wall motion abnormalities. The left ventricular internal cavity size was normal in size. There is  no left ventricular hypertrophy. Left ventricular diastolic parameters are indeterminate. Right Ventricle: The right ventricular size is mildly enlarged. Right vetricular wall thickness was not well visualized. Right ventricular systolic function is mildly reduced. There is moderately elevated pulmonary artery systolic  pressure. The tricuspid  regurgitant velocity is 3.61 m/s, and with an assumed right atrial pressure of 3 mmHg, the estimated right ventricular systolic pressure is 55.1 mmHg. Left Atrium: Left atrial size was moderately dilated. Right Atrium: Right atrial size was moderately dilated. Pericardium: There is no evidence of pericardial effusion. Mitral Valve: MR vena contracta is 0.5 cm. The mitral valve is abnormal. There is moderate thickening of the mitral valve leaflet(s). There is moderate calcification of the mitral valve leaflet(s). Moderate mitral annular calcification. Moderate mitral valve regurgitation. No evidence of mitral valve stenosis. MV peak gradient, 8.2 mmHg. The mean mitral valve gradient is 2.0 mmHg. Tricuspid Valve: The tricuspid valve is abnormal. Tricuspid valve regurgitation is moderate . No evidence of tricuspid stenosis. Aortic Valve: The aortic valve is tricuspid. There is moderate calcification of the aortic valve. There is moderate thickening of the aortic valve. There is moderate aortic valve annular calcification. Aortic valve regurgitation is not  visualized. No aortic stenosis is present. Aortic valve mean gradient measures 7.8 mmHg. Aortic valve peak gradient measures 15.7 mmHg. Aortic valve area, by VTI measures 1.93 cm. Pulmonic Valve: The pulmonic valve was not well visualized. Pulmonic valve regurgitation is mild. No evidence of pulmonic stenosis. Aorta: The aortic root and ascending aorta are structurally normal, with no evidence of dilitation. Venous: The inferior vena cava is normal in size with greater than 50% respiratory variability, suggesting right atrial pressure of 3 mmHg. IAS/Shunts: No atrial level shunt detected by color flow Doppler.  LEFT VENTRICLE PLAX 2D LVIDd:         4.50 cm LVIDs:         2.90 cm LV PW:         0.90 cm LV IVS:        1.00 cm LVOT diam:     2.00 cm LV SV:         94 LV SV Index:   53 LVOT Area:     3.14 cm  LV Volumes (MOD) LV vol d, MOD A2C: 76.7 ml LV vol d, MOD A4C: 83.8 ml LV vol s, MOD A2C: 29.0 ml LV vol s, MOD A4C: 30.3 ml LV SV MOD A2C:     47.7 ml LV SV MOD A4C:     83.8 ml LV SV MOD BP:      52.2 ml RIGHT VENTRICLE            IVC RV S prime:     9.03 cm/s  IVC diam: 1.90 cm TAPSE (M-mode): 1.7 cm LEFT ATRIUM             Index        RIGHT ATRIUM           Index LA diam:        3.80 cm 2.14 cm/m   RA Area:     23.60 cm LA Vol (A2C):   69.4 ml 39.11 ml/m  RA Volume:   73.80 ml  41.59 ml/m LA Vol (A4C):   75.8 ml 42.72 ml/m LA Biplane Vol: 73.2 ml 41.25 ml/m  AORTIC VALVE                     PULMONIC VALVE AV Area (Vmax):    1.94 cm      PR End Diast Vel: 2.20 msec AV Area (Vmean):   1.98 cm AV Area (VTI):     1.93 cm AV Vmax:  198.40 cm/s AV Vmean:          124.000 cm/s AV VTI:            0.487 m AV Peak Grad:      15.7 mmHg AV Mean Grad:      7.8 mmHg LVOT Vmax:         122.50 cm/s LVOT Vmean:        78.150 cm/s LVOT VTI:          0.298 m LVOT/AV VTI ratio: 0.61  AORTA Ao Root diam: 3.20 cm Ao Asc diam:  3.00 cm MITRAL VALVE                  TRICUSPID VALVE MV Area (PHT): 5.13 cm       TR  Peak grad:   52.1 mmHg MV Area VTI:   2.11 cm       TR Vmax:        361.00 cm/s MV Peak grad:  8.2 mmHg MV Mean grad:  2.0 mmHg       SHUNTS MV Vmax:       1.43 m/s       Systemic VTI:  0.30 m MV Vmean:      55.6 cm/s      Systemic Diam: 2.00 cm MV Decel Time: 148 msec MR Peak grad:    104.4 mmHg MR Mean grad:    73.0 mmHg MR Vmax:         511.00 cm/s MR Vmean:        408.0 cm/s MR PISA:         2.26 cm MR PISA Eff ROA: 17 mm MR PISA Radius:  0.60 cm MV E velocity: 118.00 cm/s Armida Lander MD Electronically signed by Armida Lander MD Signature Date/Time: 03/01/2024/6:52:46 PM    Final    CT Head Wo Contrast Result Date: 02/29/2024 CLINICAL DATA:  Headache, increasing frequency or severity. EXAM: CT HEAD WITHOUT CONTRAST TECHNIQUE: Contiguous axial images were obtained from the base of the skull through the vertex without intravenous contrast. RADIATION DOSE REDUCTION: This exam was performed according to the departmental dose-optimization program which includes automated exposure control, adjustment of the mA and/or kV according to patient size and/or use of iterative reconstruction technique. COMPARISON:  None Available. FINDINGS: Brain: No acute intracranial hemorrhage. No CT evidence of acute infarct. Nonspecific hypoattenuation in the periventricular and subcortical white matter favored to reflect chronic microvascular ischemic changes. Possible small remote lacunar infarcts in the right corona radiata. Mild parenchymal volume loss. No edema, mass effect, or midline shift. The basilar cisterns are patent. Ventricles: The ventricles are normal. Vascular: Atherosclerotic calcifications of the carotid siphons and intracranial vertebral arteries. No hyperdense vessel. Skull: No acute or aggressive finding. Orbits: Bilateral lens replacement. Sinuses: Mucosal thickening in the right maxillary sinus with thickening of the sinus walls suggestive of mucoperiosteal reaction. Additional mucosal thickening in the  right frontal sinus. Other: Mastoid air cells are clear. IMPRESSION: No CT evidence of acute intracranial abnormality. Chronic microvascular ischemic changes. Possible small remote lacunar infarcts in the right corona radiata. Mild parenchymal volume loss. Chronic right maxillary sinusitis. Electronically Signed   By: Denny Flack M.D.   On: 02/29/2024 19:26   DG Chest 2 View Result Date: 02/29/2024 CLINICAL DATA:  Shortness of breath. EXAM: CHEST - 2 VIEW COMPARISON:  02/11/2020 FINDINGS: Mild cardiomegaly. Mild bibasilar scarring also stable. No evidence of acute infiltrate or edema. No pleural effusion. IMPRESSION: Mild cardiomegaly and bibasilar  scarring. No active lung disease. Electronically Signed   By: Marlyce Sine M.D.   On: 02/29/2024 17:02   (Echo, Carotid, EGD, Colonoscopy, ERCP)    Subjective: Patient seen and examined.  Denies any complaints.  She feels much better.  She tells me that her leg look like real legs and feet.  Denies any shortness of breath.  Walked around in the hallway.  Daughter on the phone.   Discharge Exam: Vitals:   03/01/24 1923 03/02/24 0457  BP: (!) 139/56 (!) 166/56  Pulse: (!) 54 63  Resp: 16 16  Temp: 97.9 F (36.6 C) (!) 97.5 F (36.4 C)  SpO2: 97% 95%   Vitals:   03/01/24 1019 03/01/24 1540 03/01/24 1923 03/02/24 0457  BP: (!) 167/55 (!) 159/60 (!) 139/56 (!) 166/56  Pulse: 86 (!) 50 (!) 54 63  Resp: 18  16 16   Temp: 98 F (36.7 C)  97.9 F (36.6 C) (!) 97.5 F (36.4 C)  TempSrc:      SpO2: 97% 98% 97% 95%  Weight:      Height:        General: Pt is alert, awake, not in acute distress Cardiovascular: RRR, S1/S2 +, no rubs, no gallops, no edema.  No JVD. Respiratory: CTA bilaterally, no wheezing, no rhonchi Abdominal: Soft, NT, ND, bowel sounds + Extremities: no edema, no cyanosis    The results of significant diagnostics from this hospitalization (including imaging, microbiology, ancillary and laboratory) are listed below for  reference.     Microbiology: No results found for this or any previous visit (from the past 240 hours).   Labs: BNP (last 3 results) No results for input(s): "BNP" in the last 8760 hours. Basic Metabolic Panel: Recent Labs  Lab 02/29/24 1426 03/01/24 0649  NA 136 137  K 4.1 3.6  CL 100 99  CO2 22 27  GLUCOSE 91 94  BUN 12 16  CREATININE 0.76 0.77  CALCIUM  9.5 9.1  MG  --  1.9  PHOS  --  5.1*   Liver Function Tests: No results for input(s): "AST", "ALT", "ALKPHOS", "BILITOT", "PROT", "ALBUMIN" in the last 168 hours. No results for input(s): "LIPASE", "AMYLASE" in the last 168 hours. No results for input(s): "AMMONIA" in the last 168 hours. CBC: Recent Labs  Lab 02/29/24 1426 03/01/24 0649  WBC 8.6 8.3  HGB 11.5* 11.9*  HCT 35.7* 36.8  MCV 85.2 85.0  PLT 282 303   Cardiac Enzymes: No results for input(s): "CKTOTAL", "CKMB", "CKMBINDEX", "TROPONINI" in the last 168 hours. BNP: Invalid input(s): "POCBNP" CBG: No results for input(s): "GLUCAP" in the last 168 hours. D-Dimer No results for input(s): "DDIMER" in the last 72 hours. Hgb A1c No results for input(s): "HGBA1C" in the last 72 hours. Lipid Profile No results for input(s): "CHOL", "HDL", "LDLCALC", "TRIG", "CHOLHDL", "LDLDIRECT" in the last 72 hours. Thyroid function studies No results for input(s): "TSH", "T4TOTAL", "T3FREE", "THYROIDAB" in the last 72 hours.  Invalid input(s): "FREET3" Anemia work up No results for input(s): "VITAMINB12", "FOLATE", "FERRITIN", "TIBC", "IRON", "RETICCTPCT" in the last 72 hours. Urinalysis No results found for: "COLORURINE", "APPEARANCEUR", "LABSPEC", "PHURINE", "GLUCOSEU", "HGBUR", "BILIRUBINUR", "KETONESUR", "PROTEINUR", "UROBILINOGEN", "NITRITE", "LEUKOCYTESUR" Sepsis Labs Recent Labs  Lab 02/29/24 1426 03/01/24 0649  WBC 8.6 8.3   Microbiology No results found for this or any previous visit (from the past 240 hours).   Time coordinating discharge: 35  minutes  SIGNED:   Vada Garibaldi, MD  [facility] 03/02/2024, 9:17 AM

## 2024-04-14 ENCOUNTER — Ambulatory Visit: Attending: Cardiovascular Disease | Admitting: Cardiovascular Disease

## 2024-04-14 VITALS — BP 140/72 | HR 53 | Ht 64.5 in | Wt 150.8 lb

## 2024-04-14 DIAGNOSIS — E785 Hyperlipidemia, unspecified: Secondary | ICD-10-CM | POA: Diagnosis not present

## 2024-04-14 DIAGNOSIS — I509 Heart failure, unspecified: Secondary | ICD-10-CM

## 2024-04-14 DIAGNOSIS — I10 Essential (primary) hypertension: Secondary | ICD-10-CM | POA: Diagnosis not present

## 2024-04-14 DIAGNOSIS — I480 Paroxysmal atrial fibrillation: Secondary | ICD-10-CM | POA: Diagnosis not present

## 2024-04-14 NOTE — Assessment & Plan Note (Signed)
 Patient was admitted 02/29/2024 and discharged on 03/02/2024 with volume overload and heart failure.  Her BNP was 2800.  She was diuresed over 5 L and discharged home on furosemide .  Her lisinopril was changed to losartan .  Her 2D echo revealed normal LV systolic function with indeterminate diastolic parameters, moderate MR and TR.  We talked about the importance of salt restriction of 2 g.  She is euvolemic now and denies chest pain or shortness of breath.

## 2024-04-14 NOTE — Assessment & Plan Note (Signed)
 History of essential hypertension with blood pressure measured today at 140/72.  She is on losartan  which was just uptitrated from 50 to 100 mg as well as Bystolic .  I reviewed her blood pressure log which shows blood pressure is on the high side.  I have asked her to keep a blood pressure log for 30 days and come in to see a Pharm.D. to review and make appropriate changes.

## 2024-04-14 NOTE — Assessment & Plan Note (Signed)
 History of PAF status post DC cardioversion at [facility] 3/23 on Eliquis  oral anticoagulation maintaining sinus rhythm.

## 2024-04-14 NOTE — Patient Instructions (Signed)
 Medication Instructions:  Your physician recommends that you continue on your current medications as directed. Please refer to the Current Medication list given to you today.  *If you need a refill on your cardiac medications before your next appointment, please call your pharmacy*   Follow-Up: At [facility], you and your health needs are our priority.  As part of our continuing mission to provide you with exceptional heart care, our providers are all part of one team.  This team includes your primary Cardiologist (physician) and Advanced Practice Providers or APPs (Physician Assistants and Nurse Practitioners) who all work together to provide you with the care you need, when you need it.  Your next appointment:   6 month(s)  Provider:   Jon Hails, PA-C, Callie Goodrich, PA-C, Kathleen Johnson, PA-C, Damien Braver, NP, or Katlyn West, NP         Then, Dorn Lesches, MD will plan to see you again in 12 month(s).     We recommend signing up for the patient portal called MyChart.  Sign up information is provided on this After Visit Summary.  MyChart is used to connect with patients for Virtual Visits (Telemedicine).  Patients are able to view lab/test results, encounter notes, upcoming appointments, etc.  Non-urgent messages can be sent to your provider as well.   To learn more about what you can do with MyChart, go to [email or web address].   Other Instructions Dr. Lesches has requested that you schedule an appointment with one of our clinical pharmacists for a blood pressure check appointment within the next 6-8 weeks.  If you monitor your blood pressure (BP) at home, please bring your BP cuff and your BP readings with you to this appointment  HOW TO TAKE YOUR BLOOD PRESSURE: Rest 5 minutes before taking your blood pressure. Don't smoke or drink caffeinated beverages for at least 30 minutes before. Take your blood pressure before (not after) you eat. Sit comfortably with  your back supported and both feet on the floor (don't cross your legs). Elevate your arm to heart level on a table or a desk. Use the proper sized cuff. It should fit smoothly and snugly around your bare upper arm. There should be enough room to slip a fingertip under the cuff. The bottom edge of the cuff should be 1 inch above the crease of the elbow. Ideally, take 3 measurements at one sitting and record the average.   [facility]:

## 2024-04-14 NOTE — Progress Notes (Signed)
 04/14/2024 Toni Wells   [birth year]  [id number]  Primary Physician Kline, Chianne, PA-C Primary Cardiologist: Dorn JINNY Lesches MD [place] [place], [place]  HPI:  Toni Wells is a 89 y.o. thin-appearing widowed Caucasian female mother of 4 children, grandmother of 11 grandchildren who is accompanied by 2 of her daughters, Toni Wells and Toni Wells.  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  Her risk factors include treated hypertension hyperlipidemia.  There is no family history of heart disease.  She is never had a heart attack or stroke.  She is fairly active and does physical therapy twice a week and walks on the treadmill without limitation.  She does have history of PAF status post DC cardioversion at [facility] 3/23 maintaining sinus rhythm on Eliquis .  She was admitted to [facility] 02/29/2024 and discharged 2 days later.  BNP was 2800.  She was diuresed 5 L and has euvolemic today on furosemide .  Her 2D echo revealed normal LV systolic function, indeterminate diastolic parameters and moderate MR/TR.   Current Meds  Medication Sig   apixaban  (ELIQUIS ) 5 MG TABS tablet Take 5 mg by mouth 2 (two) times daily.   busPIRone  (BUSPAR ) 10 MG tablet Take 20 mg by mouth 2 (two) times daily.   escitalopram  (LEXAPRO ) 20 MG tablet Take 30 mg by mouth daily.   fluticasone (FLONASE) 50 MCG/ACT nasal spray Place 1 spray into both nostrils daily as needed for allergies.   furosemide  (LASIX ) 20 MG tablet Take 1 tablet (20 mg total) by mouth daily.   gabapentin  (NEURONTIN ) 100 MG capsule Take 100 mg by mouth 2 (two) times daily.   losartan  (COZAAR ) 100 MG tablet Take 100 mg by mouth daily.   Melatonin 10 MG TABS Take 10 mg by mouth at bedtime.   nebivolol  (BYSTOLIC ) 5 MG tablet Take 5 mg by mouth at bedtime.   omeprazole (PRILOSEC) 20 MG capsule Take 20 mg by mouth 2 (two) times daily.   rosuvastatin  (CRESTOR ) 5 MG tablet Take 1 tablet by mouth at bedtime.   triamcinolone cream  (KENALOG) 0.1 % Apply 1 Application topically.     Allergies  Allergen Reactions   Hydrochlorothiazide Other (See Comments)    Dizziness    Cat Dander Swelling    Swelling of eyes    Cephalexin Diarrhea and Other (See Comments)    Abdominal pain    Social History   Socioeconomic History   Marital status: Unknown    Spouse name: Not on file   Number of children: Not on file   Years of education: Not on file   Highest education level: Not on file  Occupational History   Not on file  Tobacco Use   Smoking status: Never   Smokeless tobacco: Never  Vaping Use   Vaping status: Never Used  Substance and Sexual Activity   Alcohol use: Not Currently    Comment: occ   Drug use: Not Currently   Sexual activity: Not on file  Other Topics Concern   Not on file  Social History Narrative   Not on file   Social Drivers of Health   Financial Resource Strain: Low Risk  (04/08/2024)   Received from [facility]   Overall Financial Resource Strain (CARDIA)    Difficulty of Paying Living Expenses: Not hard at all  Food Insecurity: No Food Insecurity (04/08/2024)   Received from [facility]   Hunger Vital Sign    Within the past 12 months,  you worried that your food would run out before you got the money to buy more.: Never true    Within the past 12 months, the food you bought just didn't last and you didn't have money to get more.: Never true  Transportation Needs: No Transportation Needs (04/08/2024)   Received from [facility]   PRAPARE - Transportation    Lack of Transportation (Medical): No    Lack of Transportation (Non-Medical): No  Physical Activity: Insufficiently Active (04/08/2024)   Received from [facility]   Exercise Vital Sign    On average, how many days per week do you engage in moderate to strenuous exercise (like a brisk walk)?: 2 days    On average, how many minutes do you engage in exercise at this level?: 50 min  Stress: No Stress Concern Present (04/08/2024)    Received from [facility] - Occupational Stress Questionnaire    Feeling of Stress : Only a little  Social Connections: Socially Integrated (04/08/2024)   Received from [facility]   Social Network    How would you rate your social network (family, work, friends)?: Good participation with social networks  Recent Concern: Social Connections - Socially Isolated (02/29/2024)   Social Connection and Isolation Panel    Frequency of Communication with Friends and Family: More than three times a week    Frequency of Social Gatherings with Friends and Family: More than three times a week    Attends Religious Services: Never    Database administrator or Organizations: No    Attends Banker Meetings: Never    Marital Status: Widowed  Intimate Partner Violence: Not At Risk (04/08/2024)   Received from [facility]   HITS    Over the last 12 months how often did your partner physically hurt you?: Never    Over the last 12 months how often did your partner insult you or talk down to you?: Never    Over the last 12 months how often did your partner threaten you with physical harm?: Never    Over the last 12 months how often did your partner scream or curse at you?: Never     Review of Systems: General: negative for chills, fever, night sweats or weight changes.  Cardiovascular: negative for chest pain, dyspnea on exertion, edema, orthopnea, palpitations, paroxysmal nocturnal dyspnea or shortness of breath Dermatological: negative for rash Respiratory: negative for cough or wheezing Urologic: negative for hematuria Abdominal: negative for nausea, vomiting, diarrhea, bright red blood per rectum, melena, or hematemesis Neurologic: negative for visual changes, syncope, or dizziness All other systems reviewed and are otherwise negative except as noted above.    Blood pressure (!) 140/72, pulse (!) 53, height 5' 4.5 (1.638 m), weight 150 lb  12.8 oz (68.4 kg), SpO2 99%.  General appearance: alert and no distress Neck: no adenopathy, no carotid bruit, no JVD, supple, symmetrical, trachea midline, and thyroid not enlarged, symmetric, no tenderness/mass/nodules Lungs: clear to auscultation bilaterally Heart: regular rate and rhythm, S1, S2 normal, no murmur, click, rub or gallop Extremities: extremities normal, atraumatic, no cyanosis or edema Pulses: 2+ and symmetric Skin: Skin color, texture, turgor normal. No rashes or lesions Neurologic: Grossly normal  EKG not performed today      ASSESSMENT AND PLAN:   Dyslipidemia History of hyperlipidemia on low-dose rosuvastatin  with lipid profile performed 6 months ago revealing total cholesterol 153, LDL 74 and HDL of 63.  Essential hypertension  History of essential hypertension with blood pressure measured today at 140/72.  She is on losartan  which was just uptitrated from 50 to 100 mg as well as Bystolic .  I reviewed her blood pressure log which shows blood pressure is on the high side.  I have asked her to keep a blood pressure log for 30 days and come in to see a Pharm.D. to review and make appropriate changes.  CHF (congestive heart failure) (HCC) Patient was admitted 02/29/2024 and discharged on 03/02/2024 with volume overload and heart failure.  Her BNP was 2800.  She was diuresed over 5 L and discharged home on furosemide .  Her lisinopril was changed to losartan .  Her 2D echo revealed normal LV systolic function with indeterminate diastolic parameters, moderate MR and TR.  We talked about the importance of salt restriction of 2 g.  She is euvolemic now and denies chest pain or shortness of breath.  PAF (paroxysmal atrial fibrillation) (HCC) History of PAF status post DC cardioversion at [facility] 3/23 on Eliquis  oral anticoagulation maintaining sinus rhythm.     Dorn DOROTHA Lesches MD FACP,FACC,FAHA, [facility] 04/14/2024 2:43 PM

## 2024-04-14 NOTE — Assessment & Plan Note (Signed)
 History of hyperlipidemia on low-dose rosuvastatin  with lipid profile performed 6 months ago revealing total cholesterol 153, LDL 74 and HDL of 63.

## 2024-05-29 ENCOUNTER — Ambulatory Visit: Attending: Cardiology | Admitting: Pharmacist

## 2024-05-29 VITALS — BP 152/74 | HR 54

## 2024-05-29 DIAGNOSIS — I509 Heart failure, unspecified: Secondary | ICD-10-CM | POA: Diagnosis not present

## 2024-05-29 DIAGNOSIS — I10 Essential (primary) hypertension: Secondary | ICD-10-CM

## 2024-05-29 MED ORDER — AMLODIPINE BESYLATE 5 MG PO TABS: 5.0000 mg | ORAL_TABLET | Freq: Every day | ORAL | 2 refills | Status: DC

## 2024-05-29 MED ORDER — FUROSEMIDE 20 MG PO TABS: ORAL_TABLET | ORAL | 2 refills | Status: DC

## 2024-05-29 MED ORDER — POTASSIUM CHLORIDE ER 10 MEQ PO TBCR: EXTENDED_RELEASE_TABLET | ORAL | 2 refills | Status: DC

## 2024-05-29 NOTE — Patient Instructions (Addendum)
  It was nice meeting you two today  We would like your blood pressure to be less than 130/80  Please continue your losartan  100mg  in the morning and nebivolol  5mg    We will add a new medication called amlodipine  5mg  once a day in the evening  I have refilled your Lasix  for you. Please take a potassium tablet along with it  Continue to monitor your blood pressure at home  Please have your lab work updated in about a week  I will see you back in about 6 weeks  Medford Bolk, PharmD, BCACP, CDCES, CPP [facility] [address], [place], [place] [address] Phone: [phone]; Fax: [phone] 05/29/2024 1:55 PM

## 2024-05-29 NOTE — Progress Notes (Signed)
 Patient ID: Toni Wells                 DOB: 1932-03-11                      MRN: [id number]     HPI: Nora Sabey is a 89 y.o. female referred by Dr. Court to HTN clinic. PMH is significant for A Fib, CHF, HTN and HLD.  Last EF 60-65%  Patient presents today with daughter. Managed on nebivolol  and losartan  with no reported adverse effects.  Adds salt to food. Drinks 2 cups of coffee daily. Not drinking much water but trying to increase.  EF 60-65, renal function stable.  Has been checking BP at home and readings are elevated.  Currently taking lasix  20mg  daily. No concomitant potassium.  Current HTN meds:  Nebivolol  5mg  daily Losartan  100mg  daily   Wt Readings from Last 3 Encounters:  04/14/24 150 lb 12.8 oz (68.4 kg)  03/01/24 143 lb 4.8 oz (65 kg)  05/31/20 156 lb 6.4 oz (70.9 kg)   BP Readings from Last 3 Encounters:  04/14/24 (!) 140/72  03/02/24 (!) 161/69  03/02/24 (!) 166/56   Pulse Readings from Last 3 Encounters:  04/14/24 (!) 53  03/02/24 (!) 54  03/02/24 63    Renal function: CrCl cannot be calculated (Patient's most recent lab result is older than the maximum 21 days allowed.).  Past Medical History:  Diagnosis Date   Anxiety and depression 09/13/2016   Last Assessment & Plan:   Patient is currently on Lexapro  20 mg daily.  Patient states that stable.  We will follow-up in 1 month     Arthritis    Atrial fibrillation (HCC)    CHF (congestive heart failure) (HCC) 02/29/2024   Dyslipidemia 09/13/2016   Gastroesophageal reflux disease without esophagitis 09/13/2016   Last Assessment & Plan:   Patient is currently taking omeprazole 20 mg daily and is stable.     Hypertension    Idiopathic peripheral neuropathy 09/13/2016   Osteoarthritis 09/13/2016   Other insomnia 04/10/2018   Last Assessment & Plan:    Patient is taking melatonin 5 mg over-the-counter to help with sleep onset insomnia.     Right leg pain 04/10/2018   Last Assessment & Plan:    Ackley  musculoskeletal in nature.  Patient advised to obtain muscle roller to relax muscle and will refer to physical therapy for additional exercise and support.  Patient is advised to continue meloxicam use for symptomatic relief.     Sleep disorder 09/13/2016    Current Outpatient Medications on File Prior to Visit  Medication Sig Dispense Refill   busPIRone  (BUSPAR ) 10 MG tablet Take 20 mg by mouth 3 (three) times daily.     apixaban  (ELIQUIS ) 5 MG TABS tablet Take 5 mg by mouth 2 (two) times daily.     escitalopram  (LEXAPRO ) 20 MG tablet Take 30 mg by mouth daily.     fluticasone (FLONASE) 50 MCG/ACT nasal spray Place 1 spray into both nostrils daily as needed for allergies.     furosemide  (LASIX ) 20 MG tablet Take 1 tablet (20 mg total) by mouth daily. 30 tablet 2   gabapentin  (NEURONTIN ) 100 MG capsule Take 100 mg by mouth 2 (two) times daily.     ketoconazole (NIZORAL) 2 % cream Apply 1 Application topically.     losartan  (COZAAR ) 100 MG tablet Take 100 mg by mouth daily.     Melatonin 10 MG TABS Take  10 mg by mouth at bedtime.     nebivolol  (BYSTOLIC ) 5 MG tablet Take 5 mg by mouth at bedtime.     omeprazole (PRILOSEC) 20 MG capsule Take 20 mg by mouth 2 (two) times daily.     rosuvastatin  (CRESTOR ) 5 MG tablet Take 1 tablet by mouth at bedtime.     triamcinolone cream (KENALOG) 0.1 % Apply 1 Application topically.     No current facility-administered medications on file prior to visit.    Allergies  Allergen Reactions   Hydrochlorothiazide Other (See Comments)    Dizziness    Cat Dander Swelling    Swelling of eyes    Cephalexin Diarrhea and Other (See Comments)    Abdominal pain     Assessment/Plan:  1. Hypertension -  BP in room elevated at 180/71 but decreased to 152/74 after sitting and patient feels well. Recommended addition of additional agent such as amlodipine  since home readings also elevated. Patient unsure if necessary but willing to try. Keep BP log and recheck in  1 week. Try to increase H20 intake. Recommend addition of potassium to lasix  regimen and recheck BMP in 1-2 weeks  Continue nebivolol  5 mg daily Continue losartan  100mg  daily Start amlodipine  5mg  daily Start Klor Con 10mEq daily when taking Lasix  Recheck in 4 weeks  Chris Piya Mesch, PharmD, BCACP, CDCES, CPP [facility] [address], [place], [place] [address] Phone: [phone]; Fax: [phone] 10/01/2024 7:16 PM

## 2024-06-30 ENCOUNTER — Ambulatory Visit: Admitting: Cardiovascular Disease

## 2024-07-17 ENCOUNTER — Ambulatory Visit: Attending: Student in an Organized Health Care Education/Training Program | Admitting: Pharmacist

## 2024-07-17 VITALS — BP 151/65 | HR 51

## 2024-07-17 DIAGNOSIS — I509 Heart failure, unspecified: Secondary | ICD-10-CM | POA: Diagnosis not present

## 2024-07-17 DIAGNOSIS — I10 Essential (primary) hypertension: Secondary | ICD-10-CM | POA: Diagnosis not present

## 2024-07-17 MED ORDER — POTASSIUM CHLORIDE ER 10 MEQ PO TBCR: EXTENDED_RELEASE_TABLET | ORAL | 3 refills | Status: AC

## 2024-07-17 MED ORDER — FUROSEMIDE 20 MG PO TABS: ORAL_TABLET | ORAL | 3 refills | Status: AC

## 2024-07-17 MED ORDER — AMLODIPINE BESYLATE 5 MG PO TABS: 5.0000 mg | ORAL_TABLET | Freq: Every day | ORAL | 3 refills | Status: AC

## 2024-07-17 NOTE — Patient Instructions (Signed)
 It was good seeing you again  Your blood pressure is a little high today in the room but your home readings look much better  Please continue: Amlodipine  5mg  daily Losartan  100mg  daily Nebivolol  5mg  daily Furosemide  20mg  daily Potassium 10mEq every other day  Continue to watch your blood pressure at home  Please let us  know if you have any questions   Medford Bolk, PharmD, BCACP, CDCES, CPP [facility] [address] [place], [place] [address] Phone: [phone]; Fax: [phone] 07/17/2024 2:33 PM

## 2024-07-17 NOTE — Progress Notes (Signed)
 Patient ID: Toni Wells                 DOB: 03/09/1932                      MRN: [id number]     HPI: Toni Wells is a 89 y.o. female referred by Dr. Court to HTN clinic. PMH is significant for A Fib, CHF, HTN and HLD.  Managed on nebivolol  and losartan  with no reported adverse effects. At last visit patient was started on amlodipine  5mg  and instructed to continue home BP log.  Presents today with daughter. Reports she feels better. Mood seems more upbeat. She believes it is due to the addition of the potassium chloride .  Denies HA, CP, dizziness. Has been attending physicial therapy for her back and neck.  Reports no adverse effects to amlodipine . BP readings at home have improved. Majority of readings in 130s-140s/60s.  Has not updated BMP yet.  Current HTN meds:  Nebivolol  5mg  daily Losartan  100mg  daily Amlodipine  5mg  daily   Wt Readings from Last 3 Encounters:  04/14/24 150 lb 12.8 oz (68.4 kg)  03/01/24 143 lb 4.8 oz (65 kg)  05/31/20 156 lb 6.4 oz (70.9 kg)   BP Readings from Last 3 Encounters:  04/14/24 (!) 140/72  03/02/24 (!) 161/69  03/02/24 (!) 166/56   Pulse Readings from Last 3 Encounters:  04/14/24 (!) 53  03/02/24 (!) 54  03/02/24 63    Renal function: CrCl cannot be calculated (Patient's most recent lab result is older than the maximum 21 days allowed.).  Past Medical History:  Diagnosis Date   Anxiety and depression 09/13/2016   Last Assessment & Plan:   Patient is currently on Lexapro  20 mg daily.  Patient states that stable.  We will follow-up in 1 month     Arthritis    Atrial fibrillation (HCC)    CHF (congestive heart failure) (HCC) 02/29/2024   Dyslipidemia 09/13/2016   Gastroesophageal reflux disease without esophagitis 09/13/2016   Last Assessment & Plan:   Patient is currently taking omeprazole 20 mg daily and is stable.     Hypertension    Idiopathic peripheral neuropathy 09/13/2016   Osteoarthritis 09/13/2016   Other insomnia 04/10/2018    Last Assessment & Plan:    Patient is taking melatonin 5 mg over-the-counter to help with sleep onset insomnia.     Right leg pain 04/10/2018   Last Assessment & Plan:    Ackley musculoskeletal in nature.  Patient advised to obtain muscle roller to relax muscle and will refer to physical therapy for additional exercise and support.  Patient is advised to continue meloxicam use for symptomatic relief.     Sleep disorder 09/13/2016    Current Outpatient Medications on File Prior to Visit  Medication Sig Dispense Refill   busPIRone  (BUSPAR ) 10 MG tablet Take 20 mg by mouth 3 (three) times daily.     apixaban  (ELIQUIS ) 5 MG TABS tablet Take 5 mg by mouth 2 (two) times daily.     escitalopram  (LEXAPRO ) 20 MG tablet Take 30 mg by mouth daily.     fluticasone (FLONASE) 50 MCG/ACT nasal spray Place 1 spray into both nostrils daily as needed for allergies.     furosemide  (LASIX ) 20 MG tablet Take 1 tablet (20 mg total) by mouth daily. 30 tablet 2   gabapentin  (NEURONTIN ) 100 MG capsule Take 100 mg by mouth 2 (two) times daily.     ketoconazole (NIZORAL) 2 %  cream Apply 1 Application topically.     losartan  (COZAAR ) 100 MG tablet Take 100 mg by mouth daily.     Melatonin 10 MG TABS Take 10 mg by mouth at bedtime.     nebivolol  (BYSTOLIC ) 5 MG tablet Take 5 mg by mouth at bedtime.     omeprazole (PRILOSEC) 20 MG capsule Take 20 mg by mouth 2 (two) times daily.     rosuvastatin  (CRESTOR ) 5 MG tablet Take 1 tablet by mouth at bedtime.     triamcinolone cream (KENALOG) 0.1 % Apply 1 Application topically.     No current facility-administered medications on file prior to visit.    Allergies  Allergen Reactions   Hydrochlorothiazide Other (See Comments)    Dizziness    Cat Dander Swelling    Swelling of eyes    Cephalexin Diarrhea and Other (See Comments)    Abdominal pain     Assessment/Plan:  1. Hypertension -  BP in room today improved at 145/57 and patient reports she feels better. Home  readings have improved as well. Will continue current medications. Recommend updating BMP.  Continue losartan  100mg  daily Continue nebivolol  5mg  daily Continue amlodipine  5mg  daily Recheck as needed   Medford Bolk, PharmD, BCACP, CDCES, CPP [facility] [address], [place], [place] [address] Phone: [phone]; Fax: [phone] 10/01/2024 7:16 PM

## 2024-10-13 ENCOUNTER — Telehealth: Payer: Self-pay | Admitting: Pharmacist

## 2024-10-13 ENCOUNTER — Telehealth: Payer: Self-pay | Admitting: Cardiovascular Disease

## 2024-10-13 DIAGNOSIS — I480 Paroxysmal atrial fibrillation: Secondary | ICD-10-CM

## 2024-10-13 MED ORDER — APIXABAN 5 MG PO TABS: 5.0000 mg | ORAL_TABLET | Freq: Two times a day (BID) | ORAL | 1 refills | Status: AC

## 2024-10-13 NOTE — Telephone Encounter (Signed)
" °*  STAT* If patient is at the pharmacy, call can be transferred to refill team.   1. Which medications need to be refilled? (please list name of each medication and dose if known) apixaban  (ELIQUIS ) 5 MG TABS tablet   2. Which pharmacy/location (including street and city if local pharmacy) is medication to be sent to? [facility] [phone] ph [phone] fax  3. Do they need a 30 day or 90 day supply? 90  **FOR REVIEW-THIS IS TO [place] CANNOT PULL UP PHARMACY IN SYSTEM** "

## 2024-10-13 NOTE — Telephone Encounter (Signed)
 Daughter called to report patient;s Eliquis  is over 300 dollars. Has a diagnosis of cardiomyopathy, will see if patient qualifies for [facility] grant

## 2024-10-13 NOTE — Telephone Encounter (Signed)
 Medication being reviewed by Pharmacy. Closing this encounter

## 2024-10-14 ENCOUNTER — Telehealth: Payer: Self-pay | Admitting: Pharmacy Technician

## 2024-10-14 NOTE — Telephone Encounter (Signed)
 We need ssn to apply hw grant. Lmom for daughter and lmom for patient

## 2024-10-15 NOTE — Telephone Encounter (Signed)
 Sent mychart to get ssn and asked if she is still wanting eliquis  in [place]?

## 2024-10-16 NOTE — Telephone Encounter (Signed)
 I called the patient and the daughter and left a message for both

## 2024-10-23 NOTE — Telephone Encounter (Signed)
 Getting medication from [place]
